# Patient Record
Sex: Female | Born: 1980 | Race: Black or African American | Hispanic: No | Marital: Single | Smoking: Never smoker
Health system: Southern US, Community
[De-identification: ages and names within clinical notes are randomized; demographics above are authoritative.]

## PROBLEM LIST (undated history)

## (undated) DIAGNOSIS — B009 Herpesviral infection, unspecified: Secondary | ICD-10-CM

## (undated) DIAGNOSIS — D649 Anemia, unspecified: Secondary | ICD-10-CM

## (undated) DIAGNOSIS — I10 Essential (primary) hypertension: Secondary | ICD-10-CM

## (undated) DIAGNOSIS — G43909 Migraine, unspecified, not intractable, without status migrainosus: Secondary | ICD-10-CM

## (undated) HISTORY — PX: CHOLECYSTECTOMY: SHX55

---

## 2011-02-19 ENCOUNTER — Inpatient Hospital Stay (HOSPITAL_COMMUNITY)
Admission: AD | Admit: 2011-02-19 | Discharge: 2011-02-21 | DRG: 373 | Disposition: A | Discharge status: Home / Self Care | Payer: BC Managed Care – PPO | Source: Ambulatory Visit | Attending: Obstetrics and Gynecology | Admitting: Obstetrics and Gynecology

## 2011-02-19 LAB — CBC
HCT: 42.7 % (ref 36.0–46.0)
MCHC: 32.8 g/dL (ref 30.0–36.0)
Platelets: 213 10*3/uL (ref 150–400)
RDW: 14.8 % (ref 11.5–15.5)
WBC: 9.3 10*3/uL (ref 4.0–10.5)

## 2011-02-19 LAB — RPR: RPR Ser Ql: NONREACTIVE

## 2011-02-20 LAB — CBC
MCV: 72.8 fL — ABNORMAL LOW (ref 78.0–100.0)
Platelets: 194 10*3/uL (ref 150–400)
RBC: 5.14 MIL/uL — ABNORMAL HIGH (ref 3.87–5.11)
RDW: 14.7 % (ref 11.5–15.5)
WBC: 12.4 10*3/uL — ABNORMAL HIGH (ref 4.0–10.5)

## 2011-02-21 ENCOUNTER — Inpatient Hospital Stay (HOSPITAL_COMMUNITY): Admission: AD | Admit: 2011-02-21 | Payer: Self-pay | Source: Home / Self Care | Admitting: Obstetrics and Gynecology

## 2012-04-25 ENCOUNTER — Emergency Department (HOSPITAL_COMMUNITY)
Admission: EM | Admit: 2012-04-25 | Discharge: 2012-04-25 | Disposition: A | Discharge status: Home / Self Care | Payer: BC Managed Care – PPO | Attending: Emergency Medicine | Admitting: Emergency Medicine

## 2012-04-25 ENCOUNTER — Emergency Department (HOSPITAL_COMMUNITY): Payer: BC Managed Care – PPO

## 2012-04-25 ENCOUNTER — Encounter (HOSPITAL_COMMUNITY): Payer: Self-pay | Admitting: Emergency Medicine

## 2012-04-25 DIAGNOSIS — M549 Dorsalgia, unspecified: Secondary | ICD-10-CM | POA: Insufficient documentation

## 2012-04-25 DIAGNOSIS — R1011 Right upper quadrant pain: Secondary | ICD-10-CM | POA: Insufficient documentation

## 2012-04-25 DIAGNOSIS — R109 Unspecified abdominal pain: Secondary | ICD-10-CM

## 2012-04-25 DIAGNOSIS — R110 Nausea: Secondary | ICD-10-CM | POA: Insufficient documentation

## 2012-04-25 HISTORY — DX: Herpesviral infection, unspecified: B00.9

## 2012-04-25 LAB — CBC
HCT: 38.7 % (ref 36.0–46.0)
Hemoglobin: 12.7 g/dL (ref 12.0–15.0)
MCHC: 32.8 g/dL (ref 30.0–36.0)
RBC: 5.61 MIL/uL — ABNORMAL HIGH (ref 3.87–5.11)
WBC: 10.3 10*3/uL (ref 4.0–10.5)

## 2012-04-25 LAB — URINALYSIS, ROUTINE W REFLEX MICROSCOPIC
Glucose, UA: NEGATIVE mg/dL
Ketones, ur: NEGATIVE mg/dL
Nitrite: NEGATIVE
Protein, ur: NEGATIVE mg/dL
pH: 5.5 (ref 5.0–8.0)

## 2012-04-25 LAB — BASIC METABOLIC PANEL
BUN: 10 mg/dL (ref 6–23)
CO2: 23 mEq/L (ref 19–32)
Chloride: 105 mEq/L (ref 96–112)
GFR calc non Af Amer: >90 mL/min (ref >90)
Glucose, Bld: 96 mg/dL (ref 70–99)
Potassium: 4.4 mEq/L (ref 3.5–5.1)
Sodium: 136 mEq/L (ref 135–145)

## 2012-04-25 LAB — HEPATIC FUNCTION PANEL
ALT: 10 U/L (ref 0–35)
Alkaline Phosphatase: 71 U/L (ref 39–117)
Bilirubin, Direct: <0.1 mg/dL (ref 0.0–0.3)

## 2012-04-25 LAB — URINE MICROSCOPIC-ADD ON

## 2012-04-25 LAB — PREGNANCY, URINE: Preg Test, Ur: NEGATIVE

## 2012-04-25 MED ORDER — FAMOTIDINE 20 MG PO TABS: 20.0000 mg | ORAL_TABLET | Freq: Two times a day (BID) | ORAL | Status: DC

## 2012-04-25 NOTE — ED Notes (Signed)
Patient transported to Ultrasound 

## 2012-04-25 NOTE — ED Provider Notes (Signed)
Medical screening examination/treatment/procedure(s) were performed by non-physician practitioner and as supervising physician I was immediately available for consultation/collaboration.   Lyanne Co, MD 04/25/12 [phone]

## 2012-04-25 NOTE — Discharge Instructions (Signed)
Your labs today are normal.  Your ultrasound showed a normal gallbladder, however, there was a lesion/area that appeared abnormal that was noted over the liver. This most likely is an incidental finding and may be benign, however, it is recommended that you have an MRI of abdomen for further evaluation in 6 months, scheduled by your primary care doctor.  Start pepcid for gastritis daily for few weeks. Follow up. Return if worsening   Abdominal Pain (Nonspecific) Your exam might not show the exact reason you have abdominal pain. Since there are many different causes of abdominal pain, another checkup and more tests may be needed. It is very important to follow up for lasting (persistent) or worsening symptoms. A possible cause of abdominal pain in any person who still has his or her appendix is acute appendicitis. Appendicitis is often hard to diagnose. Normal blood tests, urine tests, ultrasound, and CT scans do not completely rule out early appendicitis or other causes of abdominal pain. Sometimes, only the changes that happen over time will allow appendicitis and other causes of abdominal pain to be determined. Other potential problems that may require surgery may also take time to become more apparent. Because of this, it is important that you follow all of the instructions below. HOME CARE INSTRUCTIONS   Rest as much as possible.   Do not eat solid food until your pain is gone.   While adults or children have pain: A diet of water, weak decaffeinated tea, broth or bouillon, gelatin, oral rehydration solutions (ORS), frozen ice pops, or ice chips may be helpful.   When pain is gone in adults or children: Start a light diet (dry toast, crackers, applesauce, or white rice). Increase the diet slowly as long as it does not bother you. Eat no dairy products (including cheese and eggs) and no spicy, fatty, fried, or high-fiber foods.   Use no alcohol, caffeine, or cigarettes.   Take your regular  medicines unless your caregiver told you not to.   Take any prescribed medicine as directed.   Only take over-the-counter or prescription medicines for pain, discomfort, or fever as directed by your caregiver. Do not give aspirin to children.  If your caregiver has given you a follow-up appointment, it is very important to keep that appointment. Not keeping the appointment could result in a permanent injury and/or lasting (chronic) pain and/or disability. If there is any problem keeping the appointment, you must call to reschedule.  SEEK IMMEDIATE MEDICAL CARE IF:   Your pain is not gone in 24 hours.   Your pain becomes worse, changes location, or feels different.   You or your child has an oral temperature above 102 F (38.9 C), not controlled by medicine.   Your baby is older than 3 months with a rectal temperature of 102 F (38.9 C) or higher.   Your baby is 18 months old or younger with a rectal temperature of 100.4 F (38 C) or higher.   You have shaking chills.   You keep throwing up (vomiting) or cannot drink liquids.   There is blood in your vomit or you see blood in your bowel movements.   Your bowel movements become dark or black.   You have frequent bowel movements.   Your bowel movements stop (become blocked) or you cannot pass gas.   You have bloody, frequent, or painful urination.   You have yellow discoloration in the skin or whites of the eyes.   Your stomach becomes bloated or  bigger.   You have dizziness or fainting.   You have chest or back pain.  MAKE SURE YOU:   Understand these instructions.   Will watch your condition.   Will get help right away if you are not doing well or get worse.  Document Released: 10/18/2005 Document Revised: 10/07/2011 Document Reviewed: 09/15/2009 [facility] Patient Information 2012 [place], [place].

## 2012-04-25 NOTE — ED Provider Notes (Signed)
History     CSN: [id number]  Arrival date & time 04/25/12  0251   First MD Initiated Contact with Patient 04/25/12 [phone]      Chief Complaint  Patient presents with  . Abdominal Pain    (Consider location/radiation/quality/duration/timing/severity/associated sxs/prior treatment) Patient is a 31 y.o. female presenting with abdominal pain. The history is provided by the patient.  Abdominal Pain The primary symptoms of the illness include abdominal pain and nausea. The primary symptoms of the illness do not include fever, vomiting, diarrhea, dysuria, vaginal discharge or vaginal bleeding. The current episode started 3 to 5 hours ago. The onset of the illness was sudden. The problem has been gradually improving.  Additional symptoms associated with the illness include back pain. Symptoms associated with the illness do not include chills or constipation.  Pt states she woke up around 1am with severe right upper abdominal pain, nausea. States sat on a toilet, thinking she may have diarrhea, but had a normal bowel movement. States pain subsided slightly, and she was able to go to bed. Woke up an hour later, with worse pain than before, nausea, pain radiated to the right flank and right back. States no vomiting, no fever, no problems with bowels. No history of the same, however, does recall feeling nauseated after eating chinese food last week. Pt states since she has been here in ED (4 hrs now), she has been feeling better. Took alka seltzer at home with no improvement at that time.   Past Medical History  Diagnosis Date  . Herpes     History reviewed. No pertinent past surgical history.  Family History  Problem Relation Age of Onset  . Hypertension Other   . Diabetes Other     History  Substance Use Topics  . Smoking status: Never Smoker   . Smokeless tobacco: Not on file  . Alcohol Use: No    OB History    Grav Para Term Preterm Abortions TAB SAB Ect Mult Living                   Review of Systems  Constitutional: Negative for fever and chills.  Respiratory: Negative.   Cardiovascular: Negative.   Gastrointestinal: Positive for nausea and abdominal pain. Negative for vomiting, diarrhea, constipation and blood in stool.  Genitourinary: Positive for flank pain. Negative for dysuria, vaginal bleeding, vaginal discharge and pelvic pain.  Musculoskeletal: Positive for back pain.  Skin: Negative.   Neurological: Negative for dizziness and weakness.    Allergies  Review of patient's allergies indicates no known allergies.  Home Medications  No current outpatient prescriptions on file.  BP 144/83  Pulse 76  Temp 98.4 F (36.9 C) (Oral)  Resp 18  SpO2 100%  LMP 03/26/2012  Physical Exam  Nursing note and vitals reviewed. Constitutional: She appears well-developed and well-nourished. No distress.  HENT:  Head: Normocephalic.  Eyes: Conjunctivae are normal. No scleral icterus.  Neck: Neck supple.  Cardiovascular: Normal rate, regular rhythm and normal heart sounds.   Pulmonary/Chest: Effort normal and breath sounds normal. No respiratory distress. She has no wheezes. She has no rales.  Abdominal: Soft. Bowel sounds are normal. She exhibits no distension.       RUQ tenderness, no guarding, no rebound tenderness. No CVA tenderness bilatrally  Neurological: She is alert.  Skin: Skin is warm and dry.  Psychiatric: She has a normal mood and affect.    ED Course  Procedures (including critical care time) Results for orders placed  during the hospital encounter of 04/25/12  CBC      Component Value Range   WBC 10.3  4.0 - 10.5 K/uL   RBC 5.61 (*) 3.87 - 5.11 MIL/uL   Hemoglobin 12.7  12.0 - 15.0 g/dL   HCT 16.1  09.6 - 04.5 %   MCV 69.0 (*) 78.0 - 100.0 fL   MCH 22.6 (*) 26.0 - 34.0 pg   MCHC 32.8  30.0 - 36.0 g/dL   RDW 40.9  81.1 - 91.4 %   Platelets 225  150 - 400 K/uL  BASIC METABOLIC PANEL      Component Value Range   Sodium 136  135 - 145  mEq/L   Potassium 4.4  3.5 - 5.1 mEq/L   Chloride 105  96 - 112 mEq/L   CO2 23  19 - 32 mEq/L   Glucose, Bld 96  70 - 99 mg/dL   BUN 10  6 - 23 mg/dL   Creatinine, Ser 7.82  0.50 - 1.10 mg/dL   Calcium 9.6  8.4 - 95.6 mg/dL   GFR calc non Af Amer >90  >90 mL/min   GFR calc Af Amer >90  >90 mL/min  LIPASE, BLOOD      Component Value Range   Lipase 49  11 - 59 U/L  PREGNANCY, URINE      Component Value Range   Preg Test, Ur NEGATIVE  NEGATIVE  URINALYSIS, ROUTINE W REFLEX MICROSCOPIC      Component Value Range   Color, Urine YELLOW  YELLOW   APPearance CLOUDY (*) CLEAR   Specific Gravity, Urine 1.014  1.005 - 1.030   pH 5.5  5.0 - 8.0   Glucose, UA NEGATIVE  NEGATIVE mg/dL   Hgb urine dipstick NEGATIVE  NEGATIVE   Bilirubin Urine NEGATIVE  NEGATIVE   Ketones, ur NEGATIVE  NEGATIVE mg/dL   Protein, ur NEGATIVE  NEGATIVE mg/dL   Urobilinogen, UA 0.2  0.0 - 1.0 mg/dL   Nitrite NEGATIVE  NEGATIVE   Leukocytes, UA SMALL (*) NEGATIVE  URINE MICROSCOPIC-ADD ON      Component Value Range   Squamous Epithelial / LPF RARE  RARE   WBC, UA 0-2  <3 WBC/hpf   Bacteria, UA RARE  RARE   No results found.  6:57 AM Pt seen and examined.  Pt with severe RUQ pain, colicky in nature. Labs and UA unremarkable. PT feeling better. Will get [place] for evaluation of gallbladder.   US Abdomen Complete  04/25/2012  *RADIOLOGY REPORT*  Clinical Data:  Abdominal pain  COMPLETE ABDOMINAL ULTRASOUND  Comparison:  None.  Findings:  Gallbladder:  No gallstones, gallbladder wall thickening, or pericholecystic fluid.  Negative sonographic Murphy's sign.  Common bile duct:  Measures 6 mm.  Liver:  Vague 2.8 x 3.0 x 3.4 cm lesion in the central liver. Within normal limits in parenchymal echogenicity.  IVC:  Appears normal.  Pancreas:  Incompletely visualized but grossly unremarkable.  Spleen:  Measures 8.9 cm.  Right Kidney:  Measures 10.9 cm.  No mass or hydronephrosis.  Left Kidney:  Measures 10.5 cm.  No mass or  hydronephrosis.  Abdominal aorta:  No aneurysm identified.  IMPRESSION: Normal sonographic appearance the gallbladder.  3.4 cm lesion in the central liver, poorly characterized, statistically likely benign.  In the setting of normal LFTs, this likely reflects an incidental finding.  Consider follow-up MRI abdomen with/without contrast in 6 months for further characterization.  This recommendation follows ACR consensus guidelines: Managing Incidental Findings  on Abdominal CT: White Paper of the ACR Incidental Findings Committee.  J Am Coll Radiol 2130;8:657-846  Original Report Authenticated By: Charline Bills, M.D.    8:38 AM [place] negative. Pt is pain free. No nausea/vomiting, no abdominal pain. Abdomen reassessed, and now non tender. Explained results including the incidental liver lesion, instructed to follow up. Will d/c home.     1. Abdominal pain       MDM          Lottie Mussel, PA 04/25/12 [phone]

## 2012-04-25 NOTE — ED Notes (Signed)
Pt states she woke up at 0130 with abd pain and nausea  Pt states at that time she got up and had a normal BM  Pt states since then the pain has increased and started radiating into her back   Pt states she took alka seltzer without relief

## 2012-04-25 NOTE — ED Notes (Signed)
Pt stated that pain went away. Pt stated that she feels "gasy". Pt denies nausea or other discomfort.

## 2012-05-15 ENCOUNTER — Other Ambulatory Visit: Payer: Self-pay | Admitting: Internal Medicine

## 2012-05-15 DIAGNOSIS — R160 Hepatomegaly, not elsewhere classified: Secondary | ICD-10-CM

## 2012-10-11 ENCOUNTER — Other Ambulatory Visit: Payer: BC Managed Care – PPO

## 2012-10-12 ENCOUNTER — Ambulatory Visit
Admission: RE | Admit: 2012-10-12 | Discharge: 2012-10-12 | Disposition: A | Discharge status: Home / Self Care | Payer: BC Managed Care – PPO | Source: Ambulatory Visit | Attending: Internal Medicine | Admitting: Internal Medicine

## 2012-10-12 DIAGNOSIS — R160 Hepatomegaly, not elsewhere classified: Secondary | ICD-10-CM

## 2012-10-12 MED ORDER — GADOXETATE DISODIUM 0.25 MMOL/ML IV SOLN
9.0000 mL | Freq: Once | INTRAVENOUS | Status: AC | PRN
  Administered 2012-10-12: 9 mL via INTRAVENOUS

## 2012-11-16 ENCOUNTER — Other Ambulatory Visit: Payer: BC Managed Care – PPO

## 2015-09-03 ENCOUNTER — Other Ambulatory Visit: Payer: Self-pay | Admitting: Nurse Practitioner

## 2015-09-03 DIAGNOSIS — N644 Mastodynia: Secondary | ICD-10-CM

## 2015-09-10 ENCOUNTER — Ambulatory Visit
Admission: RE | Admit: 2015-09-10 | Discharge: 2015-09-10 | Disposition: A | Discharge status: Home / Self Care | Payer: Managed Care, Other (non HMO) | Source: Ambulatory Visit | Attending: Nurse Practitioner | Admitting: Nurse Practitioner

## 2015-09-10 DIAGNOSIS — N644 Mastodynia: Secondary | ICD-10-CM

## 2017-04-21 ENCOUNTER — Ambulatory Visit (INDEPENDENT_AMBULATORY_CARE_PROVIDER_SITE_OTHER): Payer: Worker's Compensation

## 2017-04-21 ENCOUNTER — Ambulatory Visit (HOSPITAL_COMMUNITY)
Admission: EM | Admit: 2017-04-21 | Discharge: 2017-04-21 | Disposition: A | Discharge status: Home / Self Care | Payer: Worker's Compensation | Attending: Family Medicine | Admitting: Family Medicine

## 2017-04-21 ENCOUNTER — Encounter (HOSPITAL_COMMUNITY): Payer: Self-pay | Admitting: Family Medicine

## 2017-04-21 DIAGNOSIS — M79641 Pain in right hand: Secondary | ICD-10-CM

## 2017-04-21 HISTORY — DX: Essential (primary) hypertension: I10

## 2017-04-21 MED ORDER — MELOXICAM 15 MG PO TABS: 15.0000 mg | ORAL_TABLET | Freq: Every day | ORAL | 2 refills | Status: DC

## 2017-04-21 NOTE — ED Provider Notes (Signed)
CSN: [id number]     Arrival date & time 04/21/17  1821 History   First MD Initiated Contact with Patient 04/21/17 1904     Chief Complaint  Patient presents with  . Hand Injury   (Consider location/radiation/quality/duration/timing/severity/associated sxs/prior Treatment) Rachel Newman is a 36 y.o. female with a past history of hypertension, who presents [facility] with a chief complaint of right hand pain. 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 Denies any other complaints.   The history is provided by the patient.  Hand Injury  Location:  Hand Hand location:  R hand Injury: yes   Time since incident:  3 hours Mechanism of injury comment:  Work related Pain details:    Quality:  Aching and throbbing   Radiates to:  Does not radiate   Severity:  Moderate   Onset quality:  Sudden   Duration:  3 hours   Timing:  Constant   Progression:  Unchanged Handedness:  Right-handed Dislocation: no   Prior injury to area:  No Relieved by:  None tried Worsened by:  Movement Ineffective treatments:  None tried Associated symptoms: numbness and swelling   Associated symptoms: no decreased range of motion, no fatigue, no muscle weakness, no neck pain, no stiffness and no tingling     Past Medical History:  Diagnosis Date  . Herpes   . Hypertension    History reviewed. No pertinent surgical history. Family History  Problem Relation Age of Onset  . Hypertension Other   . Diabetes Other    Social History  Substance Use Topics  . Smoking status: Never Smoker  . Smokeless tobacco: Not on file  . Alcohol use No   OB History    No data available     Review of Systems  Constitutional: Negative for chills and fatigue.  HENT:  Negative.   Respiratory: Negative.   Cardiovascular: Negative.   Gastrointestinal: Negative.   Musculoskeletal: Negative for neck pain, neck stiffness and stiffness.       Right hand pain  Skin: Negative.   Neurological: Negative for seizures and numbness.    Allergies  Patient has no known allergies.  Home Medications   Prior to Admission medications   Medication Sig Start Date End Date Taking? Authorizing Provider  famotidine (PEPCID) 20 MG tablet Take 1 tablet (20 mg total) by mouth 2 (two) times daily. 04/25/12 04/25/13  Kirichenko, Lemont Fillers, PA-C  meloxicam (MOBIC) 15 MG tablet Take 1 tablet (15 mg total) by mouth daily. 04/21/17   Dorena Bodo, NP   Meds Ordered and Administered this Visit  Medications - No data to display  BP (!) 144/90   Pulse 77   Resp 18   LMP 03/25/2017 (Exact Date)   SpO2 100%  No data found.   Physical Exam  Constitutional: She is oriented to person, place, and time. She appears well-developed and well-nourished. No distress.  HENT:  Head: Normocephalic and atraumatic.  Right Ear: External ear normal.  Left Ear: External ear normal.  Eyes: Conjunctivae are normal.  Neck: Normal range of motion.  Neurological: She is alert and oriented to person, place, and time.  Skin: Skin is warm and dry. Capillary refill takes less than 2 seconds.  No rash noted. She is not diaphoretic. No erythema.  Psychiatric: She has a normal mood and affect. Her behavior is normal.  Nursing note and vitals reviewed.   Urgent Care Course     Procedures (including critical care time)  Labs Review Labs Reviewed - No data to display  Imaging Review Dg Hand Complete Right  Result Date: 04/21/2017 CLINICAL DATA:  Finger jammed between freezer door and electronic wheelchair. EXAM: RIGHT HAND - COMPLETE 3+ VIEW COMPARISON:  None. FINDINGS: There is no evidence of fracture or dislocation. There is no evidence of arthropathy or other focal bone abnormality. Soft  tissues are unremarkable. IMPRESSION: Negative. Electronically Signed   By: Awilda Metroourtnay  Bloomer M.D.   On: 04/21/2017 19:36        MDM   1. Right hand pain     No fracture dislocation seen on x-ray, most likely soft tissue injury. Recommend rest, ice, elevation, wrist placed in a splint, started on meloxicam as needed time anti-inflammatory. Follow-up with occupational health for further evaluation and management of symptoms persist.     Dorena BodoKennard, Earnie Rockhold, NP 04/21/17 2014

## 2017-04-21 NOTE — ED Triage Notes (Signed)
Pt here for for right hand injury that occurred at work today.

## 2017-04-21 NOTE — Discharge Instructions (Signed)
X-ray negative for fracture dislocation, most likely soft tissue injury. Started on meloxicam as an anti-inflammatory, and we have splinted your wrist. If your pain persists, follow-up with occupational health for further evaluation and management. May return to full duty as needed.

## 2017-08-25 ENCOUNTER — Emergency Department (HOSPITAL_COMMUNITY)
Admission: EM | Admit: 2017-08-25 | Discharge: 2017-08-25 | Disposition: A | Discharge status: Home / Self Care | Payer: 59 | Attending: Emergency Medicine | Admitting: Emergency Medicine

## 2017-08-25 ENCOUNTER — Other Ambulatory Visit: Payer: Self-pay

## 2017-08-25 ENCOUNTER — Encounter (HOSPITAL_COMMUNITY): Payer: Self-pay | Admitting: *Deleted

## 2017-08-25 ENCOUNTER — Emergency Department (HOSPITAL_COMMUNITY): Payer: 59

## 2017-08-25 DIAGNOSIS — I10 Essential (primary) hypertension: Secondary | ICD-10-CM | POA: Diagnosis not present

## 2017-08-25 DIAGNOSIS — Z79899 Other long term (current) drug therapy: Secondary | ICD-10-CM | POA: Diagnosis not present

## 2017-08-25 DIAGNOSIS — R0789 Other chest pain: Secondary | ICD-10-CM | POA: Diagnosis not present

## 2017-08-25 DIAGNOSIS — R079 Chest pain, unspecified: Secondary | ICD-10-CM | POA: Diagnosis present

## 2017-08-25 LAB — BASIC METABOLIC PANEL
ANION GAP: 10 (ref 5–15)
BUN: 8 mg/dL (ref 6–20)
CHLORIDE: 105 mmol/L (ref 101–111)
CO2: 22 mmol/L (ref 22–32)
Calcium: 9.3 mg/dL (ref 8.9–10.3)
Creatinine, Ser: 0.82 mg/dL (ref 0.44–1.00)
GFR calc non Af Amer: >60 mL/min (ref >60)
GLUCOSE: 92 mg/dL (ref 65–99)
Potassium: 3.6 mmol/L (ref 3.5–5.1)
Sodium: 137 mmol/L (ref 135–145)

## 2017-08-25 LAB — CBC
HCT: 36.5 % (ref 36.0–46.0)
HEMOGLOBIN: 11.7 g/dL — AB (ref 12.0–15.0)
MCH: 21.5 pg — AB (ref 26.0–34.0)
MCHC: 32.1 g/dL (ref 30.0–36.0)
MCV: 67.2 fL — ABNORMAL LOW (ref 78.0–100.0)
Platelets: 289 10*3/uL (ref 150–400)
RBC: 5.43 MIL/uL — AB (ref 3.87–5.11)
RDW: 17.5 % — ABNORMAL HIGH (ref 11.5–15.5)
WBC: 6.6 10*3/uL (ref 4.0–10.5)

## 2017-08-25 LAB — I-STAT TROPONIN, ED
Troponin i, poc: 0 ng/mL (ref 0.00–0.08)
Troponin i, poc: 0 ng/mL (ref 0.00–0.08)

## 2017-08-25 MED ORDER — CYCLOBENZAPRINE HCL 10 MG PO TABS
5.0000 mg | ORAL_TABLET | Freq: Once | ORAL | Status: AC
  Administered 2017-08-25: 5 mg via ORAL
  Filled 2017-08-25: qty 1

## 2017-08-25 NOTE — ED Provider Notes (Signed)
[facility] Provider Note   CSN: [id number] Arrival date & time: 08/25/17  1611  History   Chief Complaint Chief Complaint  Patient presents with  . Chest Pain   HPI Rachel Newman Rachel Newman is a 36 y.o. female.  The history is provided by the patient.  Chest Pain   This is a new problem. The current episode started 6 to 12 hours ago. The problem occurs constantly. The problem has been gradually improving. The pain is present in the substernal region. The pain is at a severity of 2/10. The pain is mild. The quality of the pain is described as pressure-like. The pain does not radiate. Duration of episode(s) is 6 hours. Pertinent negatives include no abdominal pain, no back pain, no cough, no exertional chest pressure, no fever, no irregular heartbeat, no leg pain, no lower extremity edema, no malaise/fatigue, no nausea, no near-syncope, no numbness, no palpitations, no shortness of breath, no vomiting and no weakness. She has tried nothing for the symptoms.  Her past medical history is significant for hypertension.  Pertinent negatives for past medical history include no arrhythmia, no CAD, no COPD, no CHF, no diabetes, no DVT, no MI, no PE, no PVD, no recent injury, no seizures, no stimulant use, no strokes, no thyroid problem and no TIA.  Procedure history is negative for cardiac catheterization, echocardiogram, EPS study, persantine thallium, stress echo, stress thallium and exercise treadmill test.   Past Medical History:  Diagnosis Date  . Herpes   . Hypertension    There are no active problems to display for this patient.  History reviewed. No pertinent surgical history.  OB History    No data available     Home Medications   Prior to Admission medications   Medication Sig Start Date End Date Taking? Authorizing Provider  acetaminophen (TYLENOL) 325 MG tablet Take 325-650 mg by mouth every 6 (six) hours as needed for mild pain or headache.   Yes  [provider]  hydrochlorothiazide (HYDRODIURIL) 12.5 MG tablet Take 12.5 mg by mouth daily.   Yes [provider]  ibuprofen (ADVIL,MOTRIN) 200 MG tablet Take 200-400 mg by mouth every 6 (six) hours as needed for headache or mild pain.   Yes [provider]  Omega-3 Fatty Acids (FISH OIL) 1000 MG CAPS Take 1,000 mg by mouth every other day.   Yes [provider]  famotidine (PEPCID) 20 MG tablet Take 1 tablet (20 mg total) by mouth 2 (two) times daily. Patient not taking: Reported on 08/25/2017 04/25/12 08/25/17  Jaynie CrumbleKirichenko, Tatyana, PA-C  meloxicam (MOBIC) 15 MG tablet Take 1 tablet (15 mg total) by mouth daily. Patient not taking: Reported on 08/25/2017 04/21/17   Dorena BodoKennard, Lawrence, NP   Family History Family History  Problem Relation Age of Onset  . Hypertension Other   . Diabetes Other    Social History Social History  Substance Use Topics  . Smoking status: Never Smoker  . Smokeless tobacco: Not on file  . Alcohol use No   Allergies   Patient has no known allergies.  Review of Systems Review of Systems  Constitutional: Negative for chills, fever and malaise/fatigue.  HENT: Negative for ear pain and sore throat.   Eyes: Negative for pain and visual disturbance.  Respiratory: Negative for cough and shortness of breath.   Cardiovascular: Positive for chest pain. Negative for palpitations and near-syncope.  Gastrointestinal: Negative for abdominal pain, nausea and vomiting.  Genitourinary: Negative for dysuria and hematuria.  Musculoskeletal: Negative for arthralgias and back pain.  Skin: Negative for color change and rash.  Neurological: Negative for seizures, syncope, weakness and numbness.  All other systems reviewed and are negative.  Physical Exam Updated Vital Signs BP (!) 143/94 (BP Location: Right Arm)   Pulse 83   Temp (!) 97.5 F (36.4 C) (Oral)   Resp 18   LMP 07/26/2017 (Exact Date)   SpO2 100%   Physical Exam    Constitutional: She is oriented to person, place, and time. She appears well-developed and well-nourished. No distress.  HENT:  Head: Normocephalic and atraumatic.  Nose: Nose normal.  Eyes: Pupils are equal, round, and reactive to light. Conjunctivae and EOM are normal.  Neck: Normal range of motion. Neck supple.  Cardiovascular: Normal rate and regular rhythm.   No murmur heard. Pulmonary/Chest: Effort normal and breath sounds normal. No respiratory distress. She has no wheezes. She has no rales.  Abdominal: Soft. She exhibits no distension. There is no tenderness.  Musculoskeletal: Normal range of motion. She exhibits no edema, tenderness or deformity.  Neurological: She is alert and oriented to person, place, and time. No cranial nerve deficit or sensory deficit. She exhibits normal muscle tone. Coordination normal.  Skin: Skin is warm and dry. No rash noted. She is not diaphoretic. No erythema. No pallor.  Psychiatric: She has a normal mood and affect.  Nursing note and vitals reviewed.  ED Treatments / Results  Labs (all labs ordered are listed, but only abnormal results are displayed) Labs Reviewed  CBC - Abnormal; Notable for the following:       Result Value   RBC 5.43 (*)    Hemoglobin 11.7 (*)    MCV 67.2 (*)    MCH 21.5 (*)    RDW 17.5 (*)    All other components within normal limits  BASIC METABOLIC PANEL  I-STAT TROPONIN, ED   EKG  EKG Interpretation  Date/Time:  Thursday August 25 2017 16:21:32 EDT Ventricular Rate:  81 PR Interval:  158 QRS Duration: 86 QT Interval:  372 QTC Calculation: 432 R Axis:   76 Text Interpretation:  Normal sinus rhythm Normal ECG No old tracing to compare Confirmed by Eber Hong ([id number]) on 08/25/2017 8:43:14 PM      Radiology Dg Chest 2 View  Result Date: 08/25/2017 CLINICAL DATA:  Chest tightness.  Hypertension. EXAM: CHEST  2 VIEW COMPARISON:  None. FINDINGS: Lungs are clear. Heart size and pulmonary vascularity are  normal. No adenopathy. No pneumothorax. There is spina bifida occulta noted at C7 and T1, incidental findings. IMPRESSION: No edema or consolidation. Electronically Signed   By: Bretta Bang III M.D.   On: 08/25/2017 16:48   Procedures Procedures (including critical care time)  Medications Ordered in ED Medications - No data to display  Initial Impression / Assessment and Plan / ED Course  I have reviewed the triage vital signs and the nursing notes.  Pertinent labs & imaging results that were available during my care of the patient were reviewed by me and considered in my medical decision making (see chart for details).  Upon my evaluation patient patient endorses atypical chest pain today. Patient took aspirin prior to arrival, therefore was not given ASA here.  EKG I obtained reveals no anatomical ischemia representing STEMI, New-onset Arrhythmia, or ischemic equivalent. Therefore do not suspect ACS at this time. No concerns for Pericardial Tamponade on EKG and in light of patients hemodynamic stability. No pain related to supine or prone positions  and given EKG doubt Pericarditis.   Per the patient's absence of CAD will send Troponin. First of which is negative, Repeat troponin at 3 hour interval was also negative. Therefore also do not suspect Myocarditis.   CXR unremarkable for focal airspace disease, patient is afebrile,  cough, and WBC shows no leukocytosis, do not suspect Pneumonia. Without evidence of Pneumothorax. CXR without concern for Esophageal Tear and there is no recent intractable emesis or esophageal instrumentation.  No peritonitis or free air on CXR worrisome for Perforated Abdominal Viscous.  Unlikely Pulmonary Embolism as patient denies estrogen supplementation.  Age <50 years. (36 y.o.). Denies malignancy with treatment in last 6 months. No previous Hx of DVT/PE. Patient denies hemoptysis. No unilateral leg swelling observed on exam. Oxygenation saturation has  been maintained >95% & HR has been <100 since since arrival to the ED. No recent surgery or trauma to lower extremities or travel involving prolonged car or plane ride. Therefore will not obtain CTA Chest or D-dimer.  Pain is not described as tearing and does not radiate to back, doubt Aortic Dissection. Pulses present bilaterally in upper and lower extremities. CXR does not show widened mediastinum.  My H&P and patient's clinical course is not currently consistent with any of the above emergency medical conditions at this time. At this time feel that the patient is safe and stable for discharge. Advised the patient to follow up with her PCP in the next week and to follow up with cardiology in the 2 weeks for further evaluation if needed. She was given strict return precaution and advised to return new, worsening or reoccurring symptoms. At this time the patient is in agreement with the plan and feels safe for discharge home.   Final Clinical Impressions(s) / ED Diagnoses   Final diagnoses:  Hypertension, unspecified type  Atypical chest pain   New Prescriptions New Prescriptions   No medications on file     Lamont Snowball, MD 08/25/17 2241    Eber Hong, MD 08/28/17 [phone]

## 2017-08-25 NOTE — ED Triage Notes (Signed)
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 [facility] and told her ECG was normal and to follow up with pcp. Once pt got back to work this feeling happened again so she come to the ED. No nausea or vomiting. No diaphoresis

## 2018-02-27 ENCOUNTER — Other Ambulatory Visit: Payer: Self-pay | Admitting: Internal Medicine

## 2018-02-27 DIAGNOSIS — N63 Unspecified lump in unspecified breast: Secondary | ICD-10-CM

## 2018-03-02 ENCOUNTER — Other Ambulatory Visit: Payer: Self-pay | Admitting: Internal Medicine

## 2018-03-02 DIAGNOSIS — R921 Mammographic calcification found on diagnostic imaging of breast: Secondary | ICD-10-CM

## 2018-03-03 ENCOUNTER — Ambulatory Visit
Admission: RE | Admit: 2018-03-03 | Discharge: 2018-03-03 | Disposition: A | Discharge status: Home / Self Care | Payer: 59 | Source: Ambulatory Visit | Attending: Internal Medicine | Admitting: Internal Medicine

## 2018-03-03 DIAGNOSIS — R921 Mammographic calcification found on diagnostic imaging of breast: Secondary | ICD-10-CM

## 2018-07-10 ENCOUNTER — Encounter: Payer: Self-pay | Admitting: Emergency Medicine

## 2018-07-10 ENCOUNTER — Emergency Department (INDEPENDENT_AMBULATORY_CARE_PROVIDER_SITE_OTHER): Payer: 59

## 2018-07-10 ENCOUNTER — Other Ambulatory Visit: Payer: Self-pay

## 2018-07-10 ENCOUNTER — Emergency Department
Admission: EM | Admit: 2018-07-10 | Discharge: 2018-07-10 | Disposition: A | Discharge status: Home / Self Care | Payer: 59 | Source: Home / Self Care | Attending: Family Medicine | Admitting: Family Medicine

## 2018-07-10 DIAGNOSIS — R51 Headache: Secondary | ICD-10-CM

## 2018-07-10 DIAGNOSIS — R519 Headache, unspecified: Secondary | ICD-10-CM

## 2018-07-10 DIAGNOSIS — M26621 Arthralgia of right temporomandibular joint: Secondary | ICD-10-CM

## 2018-07-10 NOTE — Discharge Instructions (Addendum)
Apply ice pack for 20 minutes, 3 to 4 times daily  Continue until pain and swelling decrease.  May take Ibuprofen 200mg , 3 tabs every 8 hours with food.

## 2018-07-10 NOTE — ED Triage Notes (Signed)
Sinus headache, dizziness, pressure, dull pain behind right ear radiating down neck x 2 weeks

## 2018-07-10 NOTE — ED Provider Notes (Signed)
[facility]    CSN: [id number] Arrival date & time: 07/10/18  1613     History   Chief Complaint Chief Complaint  Patient presents with  . Sinusitis    HPI Rachel Newman is a 37 y.o. female.   Patient complains of intermittent pain/pressure over the frontal area of her forehead for about two months.  The pain occurs most days of the week except for about 3 days.  She feels that the pain is worsening, but does not awaken her.  No sinus congestion.  No fevers, chills, and sweats.  No other neurologic symptoms. She also complains of pain behind her right ear radiating down her right neck for two weeks.  She had a brief episode of dizziness initially, now resolved.  Ibuprofen has been somewhat helpful.  She feels well otherwise.  No fevers, chills, and sweats.  The history is provided by the patient.  Headache  Pain location:  Frontal Quality:  Dull Radiates to:  Does not radiate Severity currently:  4/10 Onset quality:  Gradual Duration:  8 weeks Timing:  Intermittent Progression:  Waxing and waning Chronicity:  New Context: emotional stress   Relieved by:  Nothing Worsened by:  Light Ineffective treatments:  Acetaminophen Associated symptoms: facial pain, neck pain and photophobia   Associated symptoms: no blurred vision, no congestion, no cough, no dizziness, no drainage, no ear pain, no eye pain, no fatigue, no fever, no focal weakness, no hearing loss, no loss of balance, no myalgias, no nausea, no near-syncope, no neck stiffness, no numbness, no paresthesias, no sinus pressure, no sore throat, no swollen glands, no syncope, no tingling, no URI, no visual change, no vomiting and no weakness     Past Medical History:  Diagnosis Date  . Herpes   . Hypertension     There are no active problems to display for this patient.   History reviewed. No pertinent surgical history.  OB History   None      Home Medications    Prior to Admission medications     Medication Sig Start Date End Date Taking? Authorizing Provider  acetaminophen (TYLENOL) 325 MG tablet Take 325-650 mg by mouth every 6 (six) hours as needed for mild pain or headache.    [provider]  famotidine (PEPCID) 20 MG tablet Take 1 tablet (20 mg total) by mouth 2 (two) times daily. Patient not taking: Reported on 08/25/2017 04/25/12 08/25/17  Jaynie Crumble, PA-C  hydrochlorothiazide (HYDRODIURIL) 12.5 MG tablet Take 12.5 mg by mouth daily.    [provider]  ibuprofen (ADVIL,MOTRIN) 200 MG tablet Take 200-400 mg by mouth every 6 (six) hours as needed for headache or mild pain.    [provider]  meloxicam (MOBIC) 15 MG tablet Take 1 tablet (15 mg total) by mouth daily. Patient not taking: Reported on 08/25/2017 04/21/17   Dorena Bodo, NP  Omega-3 Fatty Acids (FISH OIL) 1000 MG CAPS Take 1,000 mg by mouth every other day.    [provider]    Family History Family History  Problem Relation Age of Onset  . Hypertension Other   . Diabetes Other     Social History Social History   Tobacco Use  . Smoking status: Never Smoker  . Smokeless tobacco: Never Used  Substance Use Topics  . Alcohol use: No  . Drug use: No     Allergies   Patient has no known allergies.   Review of Systems Review of Systems  Constitutional:  Negative for fatigue and fever.  HENT: Negative for congestion, ear pain, hearing loss, postnasal drip, sinus pressure and sore throat.   Eyes: Positive for photophobia. Negative for blurred vision and pain.  Respiratory: Negative for cough.   Cardiovascular: Negative for syncope and near-syncope.  Gastrointestinal: Negative for nausea and vomiting.  Musculoskeletal: Positive for neck pain. Negative for myalgias and neck stiffness.  Neurological: Positive for headaches. Negative for dizziness, focal weakness, weakness, numbness, paresthesias and loss of balance.  All other systems reviewed and are  negative.    Physical Exam Triage Vital Signs ED Triage Vitals  Enc Vitals Group     BP 07/10/18 1637 (!) 159/96     Pulse Rate 07/10/18 1637 71     Resp --      Temp 07/10/18 1637 98.2 F (36.8 C)     Temp Source 07/10/18 1637 Oral     SpO2 07/10/18 1637 100 %     Weight 07/10/18 1638 232 lb (105.2 kg)     Height 07/10/18 1638 5\' 9"  (1.753 m)     Head Circumference --      Peak Flow --      Pain Score 07/10/18 1638 4     Pain Loc --      Pain Edu? --      Excl. in GC? --    No data found.  Updated Vital Signs BP (!) 159/96 (BP Location: Right Arm)   Pulse 71   Temp 98.2 F (36.8 C) (Oral)   Ht 5\' 9"  (1.753 m)   Wt 105.2 kg   LMP 06/26/2018 (Exact Date)   SpO2 100%   BMI 34.26 kg/m   Visual Acuity Right Eye Distance:   Left Eye Distance:   Bilateral Distance:    Right Eye Near:   Left Eye Near:    Bilateral Near:     Physical Exam Nursing notes and Vital Signs reviewed. Appearance:  Patient appears stated age, and in no acute distress. Eyes:  Pupils are equal, round, and reactive to light and accomodation.  Extraocular movement is intact.  Conjunctivae are not inflamed  Ears:  Canals normal.  Tympanic membranes normal.  ?mild right TMJ tenderness. Nose:  Mildly congested turbinates.  Mild maxillary sinus tenderness is present.  Pharynx:  Normal Neck:  Supple.  No adenopathy. Lungs:  Clear to auscultation.  Breath sounds are equal.  Moving air well. Heart:  Regular rate and rhythm without murmurs, rubs, or gallops.  Extremities:  No edema.  Skin:  No rash present.   Neurologic:  Cranial nerves 2 through 12 are normal.  Patellar, achilles, and elbow reflexes are normal.  Cerebellar function is intact (finger-to-nose and rapid alternating hand movement).  Gait and station are normal.    UC Treatments / Results  Labs (all labs ordered are listed, but only abnormal results are displayed) Labs Reviewed - No data to display  EKG None  Radiology Dg  Sinuses Complete  Result Date: 07/10/2018 CLINICAL DATA:  Pain and headaches over the frontal sinuses for 2 months. EXAM: PARANASAL SINUSES - COMPLETE 3 + VIEW COMPARISON:  None. FINDINGS: The paranasal sinus are aerated. There is no evidence of sinus opacification air-fluid levels or mucosal thickening. No significant bone abnormalities are seen. Mastoid air cells are clear. IMPRESSION: Normal paranasal sinuses. Electronically Signed   By: Francene Boyers M.D.   On: 07/10/2018 17:23    Procedures Procedures (including critical care time)  Medications Ordered in UC Medications - No  data to display  Initial Impression / Assessment and Plan / UC Course  I have reviewed the triage vital signs and the nursing notes.  Pertinent labs & imaging results that were available during my care of the patient were reviewed by me and considered in my medical decision making (see chart for details).    No evidence sinusitis. Recommend dental evaluation. Followup with ENT if not improved about 3 weeks.   Final Clinical Impressions(s) / UC Diagnoses   Final diagnoses:  Frontal headache  Arthralgia of right temporomandibular joint     Discharge Instructions     Apply ice pack for 20 minutes, 3 to 4 times daily  Continue until pain and swelling decrease.  May take Ibuprofen 200mg , 3 tabs every 8 hours with food.      ED Prescriptions    None         Lattie Haw, MD 07/11/18 [phone]

## 2018-11-17 ENCOUNTER — Other Ambulatory Visit: Payer: Self-pay | Admitting: Nurse Practitioner

## 2018-11-17 DIAGNOSIS — R1012 Left upper quadrant pain: Secondary | ICD-10-CM

## 2018-11-23 ENCOUNTER — Other Ambulatory Visit: Payer: Self-pay

## 2018-11-24 ENCOUNTER — Ambulatory Visit
Admission: RE | Admit: 2018-11-24 | Discharge: 2018-11-24 | Disposition: A | Discharge status: Home / Self Care | Payer: 59 | Source: Ambulatory Visit | Attending: Nurse Practitioner | Admitting: Nurse Practitioner

## 2018-11-24 DIAGNOSIS — R1012 Left upper quadrant pain: Secondary | ICD-10-CM

## 2019-10-20 ENCOUNTER — Emergency Department
Admission: EM | Admit: 2019-10-20 | Discharge: 2019-10-20 | Disposition: A | Discharge status: Home / Self Care | Payer: 59 | Attending: Emergency Medicine | Admitting: Emergency Medicine

## 2019-10-20 ENCOUNTER — Encounter: Payer: Self-pay | Admitting: Emergency Medicine

## 2019-10-20 ENCOUNTER — Emergency Department: Payer: 59

## 2019-10-20 ENCOUNTER — Other Ambulatory Visit: Payer: Self-pay

## 2019-10-20 DIAGNOSIS — R42 Dizziness and giddiness: Secondary | ICD-10-CM | POA: Diagnosis not present

## 2019-10-20 DIAGNOSIS — R079 Chest pain, unspecified: Secondary | ICD-10-CM | POA: Diagnosis present

## 2019-10-20 DIAGNOSIS — R202 Paresthesia of skin: Secondary | ICD-10-CM | POA: Diagnosis not present

## 2019-10-20 DIAGNOSIS — I10 Essential (primary) hypertension: Secondary | ICD-10-CM | POA: Diagnosis not present

## 2019-10-20 DIAGNOSIS — Z79899 Other long term (current) drug therapy: Secondary | ICD-10-CM | POA: Insufficient documentation

## 2019-10-20 DIAGNOSIS — R1012 Left upper quadrant pain: Secondary | ICD-10-CM | POA: Diagnosis not present

## 2019-10-20 LAB — BASIC METABOLIC PANEL
Anion gap: 10 (ref 5–15)
BUN: 11 mg/dL (ref 6–20)
CO2: 26 mmol/L (ref 22–32)
Calcium: 9.5 mg/dL (ref 8.9–10.3)
Chloride: 101 mmol/L (ref 98–111)
Creatinine, Ser: 0.79 mg/dL (ref 0.44–1.00)
GFR calc Af Amer: >60 mL/min (ref >60)
GFR calc non Af Amer: >60 mL/min (ref >60)
Glucose, Bld: 85 mg/dL (ref 70–99)
Potassium: 3.5 mmol/L (ref 3.5–5.1)
Sodium: 137 mmol/L (ref 135–145)

## 2019-10-20 LAB — LIPASE, BLOOD: Lipase: 28 U/L (ref 11–51)

## 2019-10-20 LAB — CBC
HCT: 37.8 % (ref 36.0–46.0)
Hemoglobin: 12.2 g/dL (ref 12.0–15.0)
MCH: 21.1 pg — ABNORMAL LOW (ref 26.0–34.0)
MCHC: 32.3 g/dL (ref 30.0–36.0)
MCV: 65.5 fL — ABNORMAL LOW (ref 80.0–100.0)
Platelets: 269 10*3/uL (ref 150–400)
RBC: 5.77 MIL/uL — ABNORMAL HIGH (ref 3.87–5.11)
RDW: 16.2 % — ABNORMAL HIGH (ref 11.5–15.5)
WBC: 7.4 10*3/uL (ref 4.0–10.5)
nRBC: 0 % (ref 0.0–0.2)

## 2019-10-20 LAB — POCT PREGNANCY, URINE: Preg Test, Ur: NEGATIVE

## 2019-10-20 LAB — TROPONIN I (HIGH SENSITIVITY)
Troponin I (High Sensitivity): <2 ng/L (ref <18)
Troponin I (High Sensitivity): <2 ng/L (ref <18)

## 2019-10-20 MED ORDER — IOHEXOL 350 MG/ML SOLN
100.0000 mL | Freq: Once | INTRAVENOUS | Status: AC | PRN
  Administered 2019-10-20: 20:00:00 100 mL via INTRAVENOUS

## 2019-10-20 NOTE — Discharge Instructions (Addendum)
Your lab test, EKG, and CT scan today were all okay.  We do not find any specific cause of your symptoms, but your evaluation today is reassuring.  Please follow-up with your doctor for further monitoring of your symptoms.

## 2019-10-20 NOTE — ED Notes (Signed)
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 Lipase added to labs, repeat troponin done at this time.

## 2019-10-20 NOTE — ED Notes (Signed)
Pt c/o chest pain upper chest that started as heaviness and now burning but has subsided since arrival, pt reports ready to leave after tests are done, pt aware awaiting CT scan, pt oriented to POC and approx wait time  Pt reports hx of reflux and declares intentional diet and positional changes to reduce flares

## 2019-10-20 NOTE — ED Triage Notes (Signed)
Pt arrived via [facility] with reports of CP while driving, pt reports feeling dizzy around 1pm, then having central chest pain radiating down left arm and c/o tingling on the left side, pt reports pain felt like a pressure, pt given 1 NTG with EMS which she states took pain from 4/10 to 2/10.  Pt reports hx of HTN, took meds this morning.  Pt given 324 ASA with EMS.  Pt following up with rheumatologist for possible autoimmune disorder.

## 2019-10-20 NOTE — ED Notes (Signed)
Peripheral IV discontinued. Catheter intact. No signs of infiltration or redness. Gauze applied to IV site.   Discharge instructions reviewed with patient. Questions fielded by this RN. Patient verbalizes understanding of instructions. Patient discharged home in stable condition per stafford . No acute distress noted at time of discharge.   Pt ambulatory to lobby

## 2019-10-20 NOTE — ED Provider Notes (Signed)
[facility] Emergency Department Provider Note  ____________________________________________  Time seen: Approximately 6:24 PM  I have reviewed the triage vital signs and the nursing notes.   HISTORY  Chief Complaint Chest Pain    HPI Rachel Newman is a 38 y.o. female with a history of hypertension who comes the ED complaining of central chest pain that started suddenly while driving today at about 1 PM.  Associated with a feeling of dizziness.  The pain is described as pressure, 4/10.  Improved to 2/10 after being given some nitroglycerin by EMS.  Initially the pain was also associated with paresthesia of the left side of the body which lasted for 2 minutes and then resolved.  She now reports that the pain radiates to her abdomen and lower back.  Denies any connective tissue disorder personally or in her family.  No history of joint laxity.  She is being evaluated by rheumatology for possible autoimmune disorder due to an elevated ANA level.  No recent exertional symptoms or pleuritic symptoms.  No travel trauma hospitalization surgery or history of DVT or PE or exogenous hormone use.     Past Medical History:  Diagnosis Date  . Herpes   . Hypertension      There are no problems to display for this patient.    History reviewed. No pertinent surgical history.   Prior to Admission medications   Medication Sig Start Date End Date Taking? Authorizing Provider  acetaminophen (TYLENOL) 325 MG tablet Take 325-650 mg by mouth every 6 (six) hours as needed for mild pain or headache.    [provider]  famotidine (PEPCID) 20 MG tablet Take 1 tablet (20 mg total) by mouth 2 (two) times daily. Patient not taking: Reported on 08/25/2017 04/25/12 08/25/17  Jaynie Crumble, PA-C  hydrochlorothiazide (HYDRODIURIL) 12.5 MG tablet Take 12.5 mg by mouth daily.    [provider]  ibuprofen (ADVIL,MOTRIN) 200 MG tablet Take 200-400 mg by mouth every  6 (six) hours as needed for headache or mild pain.    [provider]  meloxicam (MOBIC) 15 MG tablet Take 1 tablet (15 mg total) by mouth daily. Patient not taking: Reported on 08/25/2017 04/21/17   Dorena Bodo, NP  Omega-3 Fatty Acids (FISH OIL) 1000 MG CAPS Take 1,000 mg by mouth every other day.    [provider]     Allergies Patient has no known allergies.   Family History  Problem Relation Age of Onset  . Hypertension Other   . Diabetes Other     Social History Social History   Tobacco Use  . Smoking status: Never Smoker  . Smokeless tobacco: Never Used  Substance Use Topics  . Alcohol use: No  . Drug use: No    Review of Systems  Constitutional:   No fever or chills.  ENT:   No sore throat. No rhinorrhea. Cardiovascular: Positive as above chest pain without syncope. Respiratory:   No dyspnea or cough. Gastrointestinal:   Negative for abdominal pain, vomiting and diarrhea.  Musculoskeletal:   Negative for focal pain or swelling All other systems reviewed and are negative except as documented above in ROS and HPI.  ____________________________________________   PHYSICAL EXAM:  VITAL SIGNS: ED Triage Vitals [10/20/19 1424]  Enc Vitals Group     BP 128/76     Pulse Rate 88     Resp 18     Temp 98.7 F (37.1 C)     Temp Source Oral  SpO2 100 %     Weight 235 lb (106.6 kg)     Height 5\' 10"  (1.778 m)     Head Circumference      Peak Flow      Pain Score 2     Pain Loc      Pain Edu?      Excl. in GC?     Vital signs reviewed, nursing assessments reviewed.   Constitutional:   Alert and oriented. Non-toxic appearance. Eyes:   Conjunctivae are normal. EOMI. PERRL. ENT      Head:   Normocephalic and atraumatic.      Nose:   Wearing a mask.      Mouth/Throat:   Wearing a mask.      Neck:   No meningismus. Full ROM. Hematological/Lymphatic/Immunilogical:   No cervical lymphadenopathy. Cardiovascular:   RRR. Symmetric  bilateral radial and DP pulses.  No murmurs. Cap refill less than 2 seconds. Respiratory:   Normal respiratory effort without tachypnea/retractions. Breath sounds are clear and equal bilaterally. No wheezes/rales/rhonchi. Gastrointestinal:   Soft and nontender. Non distended. There is no CVA tenderness.  No rebound, rigidity, or guarding. Genitourinary:   deferred Musculoskeletal:   Normal range of motion in all extremities. No joint effusions.  No lower extremity tenderness.  No edema. Neurologic:   Normal speech and language.  Motor grossly intact. No acute focal neurologic deficits are appreciated.  Skin:    Skin is warm, dry and intact. No rash noted.  No petechiae, purpura, or bullae.  ____________________________________________    LABS (pertinent positives/negatives) (all labs ordered are listed, but only abnormal results are displayed) Labs Reviewed  CBC - Abnormal; Notable for the following components:      Result Value   RBC 5.77 (*)    MCV 65.5 (*)    MCH 21.1 (*)    RDW 16.2 (*)    All other components within normal limits  BASIC METABOLIC PANEL  LIPASE, BLOOD  POC URINE PREG, ED  POCT PREGNANCY, URINE  TROPONIN I (HIGH SENSITIVITY)  TROPONIN I (HIGH SENSITIVITY)   ____________________________________________   EKG  Interpreted by me  Date: 10/20/2019  Rate: 90  Rhythm: normal sinus rhythm  QRS Axis: normal  Intervals: normal  ST/T Wave abnormalities: normal  Conduction Disutrbances: none  Narrative Interpretation: unremarkable Unchanged compared to previous    ____________________________________________    RADIOLOGY  DG Chest 2 View  Result Date: 10/20/2019 CLINICAL DATA:  Left-sided chest pain. EXAM: CHEST - 2 VIEW COMPARISON:  Chest x-ray dated August 25, 2017. FINDINGS: The heart size and mediastinal contours are within normal limits. Both lungs are clear. The visualized skeletal structures are unremarkable. IMPRESSION: No active  cardiopulmonary disease. Electronically Signed   By: Obie DredgeWilliam T Derry M.D.   On: 10/20/2019 16:18   CT Angio Chest/Abd/Pel for Dissection W and/or Wo Contrast  Result Date: 10/20/2019 CLINICAL DATA:  Aortic disease. Nontraumatic. Central chest pain radiating down left arm. EXAM: CT ANGIOGRAPHY CHEST, ABDOMEN AND PELVIS TECHNIQUE: Multidetector CT imaging through the chest, abdomen and pelvis was performed using the standard protocol during bolus administration of intravenous contrast. Multiplanar reconstructed images and MIPs were obtained and reviewed to evaluate the vascular anatomy. CONTRAST:  100mL OMNIPAQUE IOHEXOL 350 MG/ML SOLN COMPARISON:  None. FINDINGS: CTA CHEST FINDINGS Cardiovascular: There is no evidence for thoracic aortic aneurysm or dissection. There is no large centrally located pulmonary embolism. Detection of smaller pulmonary emboli is limited by contrast bolus timing and technique. There is no  significant pericardial effusion. The heart size is normal. Mediastinum/Nodes: --No mediastinal or hilar lymphadenopathy. --No axillary lymphadenopathy. --No supraclavicular lymphadenopathy. --Normal thyroid gland. --The esophagus is unremarkable Lungs/Pleura: There is a tiny 2 mm micronodule in the right upper lobe (axial series 6, image 40). There are additional scattered micro nodules in the right upper lobe. There is no pneumothorax. No large pleural effusion. The trachea is unremarkable. Musculoskeletal: No chest wall abnormality. No acute or significant osseous findings. Review of the MIP images confirms the above findings. CTA ABDOMEN AND PELVIS FINDINGS VASCULAR Aorta: Normal caliber aorta without aneurysm, dissection, vasculitis or significant stenosis. Celiac: Patent without evidence of aneurysm, dissection, vasculitis or significant stenosis. SMA: Patent without evidence of aneurysm, dissection, vasculitis or significant stenosis. Renals: Both renal arteries are patent without evidence of  aneurysm, dissection, vasculitis, fibromuscular dysplasia or significant stenosis. IMA: Patent without evidence of aneurysm, dissection, vasculitis or significant stenosis. Inflow: Patent without evidence of aneurysm, dissection, vasculitis or significant stenosis. Veins: No obvious venous abnormality within the limitations of this arterial phase study. Review of the MIP images confirms the above findings. NON-VASCULAR Hepatobiliary: There is a hypodense 2.8 cm lesion in the left hepatic lobe that is favored to represent the patient's previously demonstrated hemangioma. Normal gallbladder.There is no biliary ductal dilation. Pancreas: Normal contours without ductal dilatation. No peripancreatic fluid collection. Spleen: No splenic laceration or hematoma. Adrenals/Urinary Tract: --Adrenal glands: No adrenal hemorrhage. --Right kidney/ureter: No hydronephrosis or perinephric hematoma. --Left kidney/ureter: No hydronephrosis or perinephric hematoma. --Urinary bladder: Unremarkable. Stomach/Bowel: --Stomach/Duodenum: No hiatal hernia or other gastric abnormality. Normal duodenal course and caliber. --Small bowel: No dilatation or inflammation. --Colon: No focal abnormality. --Appendix: Normal. Vascular/Lymphatic: Normal course and caliber of the major abdominal vessels. --No retroperitoneal lymphadenopathy. --No mesenteric lymphadenopathy. --No pelvic or inguinal lymphadenopathy. Reproductive: Unremarkable Other: No ascites or free air. The abdominal wall is normal. Musculoskeletal. No acute displaced fractures. Review of the MIP images confirms the above findings. IMPRESSION: 1. No acute thoracic, abdominal or pelvic pathology. Specifically, there is no evidence for aortic aneurysm or dissection. 2. Hypodense lesion in the left hepatic lobe is favored to represent the patient's previously demonstrated hemangioma. Electronically Signed   By: Katherine Mantle M.D.   On: 10/20/2019 20:16     ____________________________________________   PROCEDURES Procedures  ____________________________________________  DIFFERENTIAL DIAGNOSIS   Aortic dissection, non-STEMI  CLINICAL IMPRESSION / ASSESSMENT AND PLAN / ED COURSE  Medications ordered in the ED: Medications  iohexol (OMNIPAQUE) 350 MG/ML injection 100 mL (100 mLs Intravenous Contrast Given 10/20/19 1938)    Pertinent labs & imaging results that were available during my care of the patient were reviewed by me and considered in my medical decision making (see chart for details).  Rachel Newman was evaluated in Emergency Department on 10/20/2019 for the symptoms described in the history of present illness. She was evaluated in the context of the global COVID-19 pandemic, which necessitated consideration that the patient might be at risk for infection with the SARS-CoV-2 virus that causes COVID-19. Institutional protocols and algorithms that pertain to the evaluation of patients at risk for COVID-19 are in a state of rapid change based on information released by regulatory bodies including the CDC and federal and state organizations. These policies and algorithms were followed during the patient's care in the ED.     Clinical Course as of Oct 19 2026  Sat Oct 20, 2019  1610 Patient presents with central chest pain associated with upper abdominal pain radiating to the back.  Sudden onset earlier today.  EKG, chest x-ray, labs, exam are all normal.  However, with her sudden onset of radiating chest pain, associated with an initial neuro symptom and the patient reporting that she is being worked up for a autoimmune disorder, this raises the possibility of aortic dissection.  Discussed with the patient the possibility obtaining CT scan today which she agrees.  She is scheduled for an upcoming CT scan to evaluate an abdominal mass which will likely be adequately evaluated on this study   [PS]  2023 CT chest abdomen pelvis angiogram  negative for dissection or other acute findings.  Patient remains calm and comfortable in the treatment bed.  Stable for discharge home.   [PS]    Clinical Course User Index [PS] Carrie Mew, MD     ____________________________________________   FINAL CLINICAL IMPRESSION(S) / ED DIAGNOSES    Final diagnoses:  Nonspecific chest pain     ED Discharge Orders    None      Portions of this note were generated with dragon dictation software. Dictation errors may occur despite best attempts at proofreading.   Carrie Mew, MD 10/20/19 2027

## 2019-11-29 ENCOUNTER — Other Ambulatory Visit (HOSPITAL_COMMUNITY): Payer: Self-pay | Admitting: Gastroenterology

## 2019-11-29 ENCOUNTER — Other Ambulatory Visit: Payer: Self-pay | Admitting: Gastroenterology

## 2019-11-29 DIAGNOSIS — R1011 Right upper quadrant pain: Secondary | ICD-10-CM

## 2019-12-12 ENCOUNTER — Encounter (HOSPITAL_COMMUNITY): Payer: Self-pay

## 2019-12-12 ENCOUNTER — Encounter (HOSPITAL_COMMUNITY): Payer: PRIVATE HEALTH INSURANCE

## 2019-12-21 ENCOUNTER — Ambulatory Visit (HOSPITAL_COMMUNITY)
Admission: RE | Admit: 2019-12-21 | Discharge: 2019-12-21 | Disposition: A | Discharge status: Home / Self Care | Payer: 59 | Source: Ambulatory Visit | Attending: Gastroenterology | Admitting: Gastroenterology

## 2019-12-21 ENCOUNTER — Encounter (HOSPITAL_COMMUNITY): Payer: Self-pay

## 2019-12-21 ENCOUNTER — Other Ambulatory Visit: Payer: Self-pay

## 2019-12-21 DIAGNOSIS — R1011 Right upper quadrant pain: Secondary | ICD-10-CM

## 2020-01-15 ENCOUNTER — Other Ambulatory Visit: Payer: Self-pay | Admitting: Surgery

## 2020-01-17 ENCOUNTER — Emergency Department
Admission: EM | Admit: 2020-01-17 | Discharge: 2020-01-17 | Disposition: A | Discharge status: Home / Self Care | Payer: 59 | Source: Home / Self Care | Attending: Family Medicine | Admitting: Family Medicine

## 2020-01-17 ENCOUNTER — Encounter: Payer: Self-pay | Admitting: Emergency Medicine

## 2020-01-17 ENCOUNTER — Other Ambulatory Visit: Payer: Self-pay

## 2020-01-17 DIAGNOSIS — R202 Paresthesia of skin: Secondary | ICD-10-CM

## 2020-01-17 LAB — POCT CBC W AUTO DIFF (K'VILLE URGENT CARE)

## 2020-01-17 NOTE — Discharge Instructions (Addendum)
Recommend obtaining second Moderna COVID19 vaccine.

## 2020-01-17 NOTE — ED Provider Notes (Signed)
Rachel Newman CARE    CSN: [id number] Arrival date & time: 01/17/20  5176      History   Chief Complaint Chief Complaint  Patient presents with  . Arm Pain    HPI Rachel Newman is a 39 y.o. female.   Patient complains of onset of vague mild tingling sensation in her left finger tips and arm after eating dinner last night.  She also noted a vague abnormal sensation in her left leg.  The symptoms lasted about two hours.  Today she felt slightly light-headed but otherwise feels well. She reports that she had similar symptoms on Oct 20, 2019, having been thoroughly evaluated at the [facility] ED with negative EKG, chest X-ray, CT angio chst/abdomen/pelvis and routine lab work. She reports that she had her first [place] vaccination on 01 Nov 2019.  On January 10 she developed a COVID19-like illness from which she recovered without incident.  On 12/27/19 she tested positive for COVID19 IgG antibodies.  She has not yet had the second [place] vaccination.   The history is provided by the patient.    Past Medical History:  Diagnosis Date  . Herpes   . Hypertension     Active problems:  hypertension and iron deficiency  Surgical history:  none  OB History    Gravida 1      Home Medications    Prior to Admission medications   Medication Sig Start Date End Date Taking? Authorizing Provider  calcium-vitamin D (OSCAL WITH D) 500-200 MG-UNIT tablet Take 1 tablet by mouth.   Yes [provider]  Iron-Vitamin C (IRON 100/C) 100-250 MG TABS Take by mouth.   Yes [provider]  acetaminophen (TYLENOL) 325 MG tablet Take 325-650 mg by mouth every 6 (six) hours as needed for mild pain or headache.    [provider]  hydrochlorothiazide (HYDRODIURIL) 12.5 MG tablet Take 12.5 mg by mouth daily.    [provider]  ibuprofen (ADVIL,MOTRIN) 200 MG tablet Take 200-400 mg by mouth every 6 (six) hours as needed for headache  or mild pain.    [provider]    Family History Family History  Problem Relation Age of Onset  . Hypertension Other   . Diabetes Other   . Hypertension Mother   . Heart failure Mother     Social History Social History   Tobacco Use  . Smoking status: Never Smoker  . Smokeless tobacco: Never Used  Substance Use Topics  . Alcohol use: No  . Drug use: No     Allergies   Patient has no known allergies.   Review of Systems Review of Systems  Constitutional: Negative for activity change, appetite change, chills, diaphoresis, fatigue, fever and unexpected weight change.  HENT: Negative.   Eyes: Negative.   Respiratory: Negative.   Cardiovascular: Negative.   Gastrointestinal: Negative.   Genitourinary: Negative.   Musculoskeletal: Negative.   Skin: Negative.   Neurological: Positive for dizziness and light-headedness. Negative for tremors, seizures, syncope, facial asymmetry, speech difficulty, weakness, numbness and headaches.  Hematological: Negative.      Physical Exam Triage Vital Signs ED Triage Vitals [01/17/20 0943]  Enc Vitals Group     BP      Pulse      Resp      Temp      Temp src      SpO2      Weight 225 lb (102.1 kg)     Height 5'  9" (1.753 m)     Head Circumference      Peak Flow      Pain Score 0     Pain Loc      Pain Edu?      Excl. in GC?    No data found.  Updated Vital Signs Ht 5\' 9"  (1.753 m)   Wt 102.1 kg   LMP 12/25/2019   BMI 33.23 kg/m   Visual Acuity Right Eye Distance:   Left Eye Distance:   Bilateral Distance:    Right Eye Near:   Left Eye Near:    Bilateral Near:     Physical Exam Nursing notes and Vital Signs reviewed. Appearance:  Patient appears stated age, and in no acute distress Eyes:  Pupils are equal, round, and reactive to light and accomodation.  Extraocular movement is intact.  Conjunctivae are not inflamed  Ears:  Canals normal.  Tympanic membranes normal.  Nose:  Normal turbinates.   No sinus tenderness.    Pharynx:  Normal Neck:  Supple.  Shotty lateral nodes, mildly tender on the left.  No thyromegaly.  Carotids have normal upstrokes.  Lungs:  Clear to auscultation.  Breath sounds are equal.  Moving air well. Heart:  Regular rate and rhythm without murmurs, rubs, or gallops.  Abdomen:  Nontender without masses or hepatosplenomegaly.  Bowel sounds are present.  No CVA or flank tenderness.  Extremities:  No edema.  Skin:  No rash present.  Neurologic:  Cranial nerves 2 through 12 are normal.  Patellar, achilles, and elbow reflexes are normal.  Cerebellar function is intact (finger-to-nose and rapid alternating hand movement).  Gait and station are normal.   Romberg negative.   UC Treatments / Results  Labs (all labs ordered are listed, but only abnormal results are displayed) Labs Reviewed  POCT CBC W AUTO DIFF ([facility]):  WBC 6.7; LY 17.8; MO 4.2; GR 78.0; Hgb 13.1; Platelets 287     EKG   Radiology No results found.  Procedures Procedures (including critical care time)  Medications Ordered in UC Medications - No data to display  Initial Impression / Assessment and Plan / UC Course  I have reviewed the triage vital signs and the nursing notes.  Pertinent labs & imaging results that were available during my care of the patient were reviewed by me and considered in my medical decision making (see chart for details).    Review of records reveals that patient has a history of iron deficiency without anemia.  Vitamin B12 and folate levels were normal on 11/16/19. Unremarkable physical exam (including normal neurologic exam) today.  ?COVID19 sequelae.  Recommend follow-up with neurologist   Final Clinical Impressions(s) / UC Diagnoses   Final diagnoses:  Paresthesia of left arm and leg     Discharge Instructions     Recommend obtaining second Moderna COVID19 vaccine.    ED Prescriptions    None        11/18/19, MD 01/17/20  1212

## 2020-01-17 NOTE — ED Triage Notes (Signed)
Left arm and left leg pins and needles, tingling sensation for a couple of hours before falling asleep. Not in pain, she does have a gall stone,which needs to come out

## 2020-01-22 ENCOUNTER — Emergency Department (HOSPITAL_BASED_OUTPATIENT_CLINIC_OR_DEPARTMENT_OTHER): Payer: 59

## 2020-01-22 ENCOUNTER — Other Ambulatory Visit: Payer: Self-pay

## 2020-01-22 ENCOUNTER — Emergency Department (HOSPITAL_BASED_OUTPATIENT_CLINIC_OR_DEPARTMENT_OTHER)
Admission: EM | Admit: 2020-01-22 | Discharge: 2020-01-22 | Disposition: A | Discharge status: Home / Self Care | Payer: 59 | Attending: Emergency Medicine | Admitting: Emergency Medicine

## 2020-01-22 ENCOUNTER — Encounter (HOSPITAL_BASED_OUTPATIENT_CLINIC_OR_DEPARTMENT_OTHER): Payer: Self-pay | Admitting: *Deleted

## 2020-01-22 DIAGNOSIS — R202 Paresthesia of skin: Secondary | ICD-10-CM | POA: Insufficient documentation

## 2020-01-22 DIAGNOSIS — R519 Headache, unspecified: Secondary | ICD-10-CM | POA: Diagnosis not present

## 2020-01-22 DIAGNOSIS — I10 Essential (primary) hypertension: Secondary | ICD-10-CM | POA: Diagnosis not present

## 2020-01-22 DIAGNOSIS — Z7902 Long term (current) use of antithrombotics/antiplatelets: Secondary | ICD-10-CM | POA: Insufficient documentation

## 2020-01-22 NOTE — Discharge Instructions (Addendum)
Follow-up with your neurologist and your primary care doctor.  Return to the ED if symptoms worsen as discussed.

## 2020-01-22 NOTE — ED Provider Notes (Signed)
[facility] EMERGENCY DEPARTMENT Provider Note   CSN: [id number] Arrival date & time: 01/22/20  1726     History Chief Complaint  Patient presents with  . Tingling    Rachel Newman is a 39 y.o. female.  The history is provided by the patient.  Neurologic Problem This is a new problem. The current episode started 3 to 5 hours ago. The problem occurs rarely. The problem has been rapidly improving. Associated symptoms include headaches. Pertinent negatives include no chest pain, no abdominal pain and no shortness of breath. Nothing aggravates the symptoms. The symptoms are relieved by NSAIDs. The treatment provided mild relief.       Past Medical History:  Diagnosis Date  . Herpes   . Hypertension     There are no problems to display for this patient.   History reviewed. No pertinent surgical history.   OB History   No obstetric history on file.     Family History  Problem Relation Age of Onset  . Hypertension Other   . Diabetes Other   . Hypertension Mother   . Heart failure Mother     Social History   Tobacco Use  . Smoking status: Never Smoker  . Smokeless tobacco: Never Used  Substance Use Topics  . Alcohol use: No  . Drug use: No    Home Medications Prior to Admission medications   Medication Sig Start Date End Date Taking? Authorizing Provider  calcium-vitamin D (OSCAL WITH D) 500-200 MG-UNIT tablet Take 1 tablet by mouth.   Yes [provider]  hydrochlorothiazide (HYDRODIURIL) 12.5 MG tablet Take 12.5 mg by mouth daily.   Yes [provider]  ibuprofen (ADVIL,MOTRIN) 200 MG tablet Take 200-400 mg by mouth every 6 (six) hours as needed for headache or mild pain.   Yes [provider]  Iron-Vitamin C (IRON 100/C) 100-250 MG TABS Take by mouth.   Yes [provider]  acetaminophen (TYLENOL) 325 MG tablet Take 325-650 mg by mouth every 6 (six) hours as needed for mild pain or headache.    [provider]    Allergies    Patient has no known allergies.  Review of Systems   Review of Systems  Constitutional: Negative for chills and fever.  HENT: Negative for ear pain and sore throat.   Eyes: Negative for pain and visual disturbance.  Respiratory: Negative for cough and shortness of breath.   Cardiovascular: Negative for chest pain and palpitations.  Gastrointestinal: Negative for abdominal pain and vomiting.  Genitourinary: Negative for dysuria and hematuria.  Musculoskeletal: Negative for arthralgias and back pain.  Skin: Negative for color change and rash.  Neurological: Positive for headaches. Negative for seizures and syncope.  All other systems reviewed and are negative.   Physical Exam Updated Vital Signs BP 140/84 (BP Location: Right Arm)   Pulse 70   Temp 98.7 F (37.1 C) (Oral)   Resp 14   Ht 5\' 9"  (1.753 m)   Wt 101.2 kg   LMP 01/22/2020   SpO2 100%   BMI 32.93 kg/m   Physical Exam Vitals and nursing note reviewed.  Constitutional:      General: She is not in acute distress.    Appearance: She is well-developed. She is not ill-appearing.  HENT:     Head: Normocephalic and atraumatic.     Nose: Nose normal.     Mouth/Throat:     Mouth: Mucous membranes are moist.  Eyes:     Extraocular  Movements: Extraocular movements intact.     Conjunctiva/sclera: Conjunctivae normal.     Pupils: Pupils are equal, round, and reactive to light.  Cardiovascular:     Rate and Rhythm: Normal rate and regular rhythm.     Pulses: Normal pulses.     Heart sounds: Normal heart sounds. No murmur.  Pulmonary:     Effort: Pulmonary effort is normal. No respiratory distress.     Breath sounds: Normal breath sounds.  Abdominal:     Palpations: Abdomen is soft.     Tenderness: There is no abdominal tenderness.  Musculoskeletal:     Cervical back: Neck supple.  Skin:    General: Skin is warm and dry.     Capillary Refill: Capillary refill takes less than 2  seconds.     Findings: No rash.  Neurological:     General: No focal deficit present.     Mental Status: She is alert and oriented to person, place, and time.     Cranial Nerves: No cranial nerve deficit.     Sensory: No sensory deficit.     Motor: No weakness.     Coordination: Coordination normal.     Gait: Gait normal.     Comments: 5+ out of 5 strength throughout, normal sensation, no drift, normal finger-to-nose finger, normal gait, normal speech     ED Results / Procedures / Treatments   Labs (all labs ordered are listed, but only abnormal results are displayed) Labs Reviewed  PREGNANCY, URINE    EKG None  Radiology CT Head Wo Contrast  Result Date: 01/22/2020 CLINICAL DATA:  39 year old female with headache. EXAM: CT HEAD WITHOUT CONTRAST TECHNIQUE: Contiguous axial images were obtained from the base of the skull through the vertex without intravenous contrast. COMPARISON:  None. FINDINGS: Brain: No evidence of acute infarction, hemorrhage, hydrocephalus, extra-axial collection or mass lesion/mass effect. Vascular: No hyperdense vessel or unexpected calcification. Skull: Normal. Negative for fracture or focal lesion. Sinuses/Orbits: No acute finding. Other: None IMPRESSION: Normal noncontrast CT of the brain. Electronically Signed   By: Elgie Collard M.D.   On: 01/22/2020 18:22    Procedures Procedures (including critical care time)  Medications Ordered in ED Medications - No data to display  ED Course  I have reviewed the triage vital signs and the nursing notes.  Pertinent labs & imaging results that were available during my care of the patient were reviewed by me and considered in my medical decision making (see chart for details).    MDM Rules/Calculators/A&P                      Rachel Newman is a 39 year old female history of migraines who presents to the ED with tingling in her left lower leg.  Patient with normal vitals.  No fever.  Patient with symptoms  on and off like this since last week.  She had left upper arm paresthesias last week.  Had some left lower leg paresthesias this afternoon.  They have been associated with mild headaches.  She actually saw neurology recently and they are beginning the work-up.  She has no history of MS in the family.  She has had no weakness, speech changes, vision changes.  She has a normal neurological exam.  She has paresthesia feeling to the left lateral leg but normal sensation to touch.  No back pain.  No urinary symptoms.  She has mild headache that she states has gotten better with Motrin.  No history  of these type of feelings with her prior headaches.  However she states she has been having more frequent headaches since the beginning of the year.  Shared decision was made to get a head CT as patient has not had any imaging during this time.  Primary care doctor is also having her worked up for sleep apnea as a possible cause of this as well.  Head CT is unremarkable.  At this time no concern for stroke or TIA.  Suspect likely complex migraines.  However encouraged follow-up with neurology and her primary care doctor.  She is given return precautions and discharged from the ED in good condition.  This chart was dictated using voice recognition software.  Despite best efforts to proofread,  errors can occur which can change the documentation meaning.    Final Clinical Impression(s) / ED Diagnoses Final diagnoses:  Tingling    Rx / DC Orders ED Discharge Orders    None       Lennice Sites, DO 01/22/20 1950

## 2020-01-22 NOTE — ED Triage Notes (Signed)
Last week she had tingling in her left leg and left arm. She was seen at [facility] and she was told to follow up with her neurologist.  2 hours ago she started having tingling in her left leg. She is ambulatory.

## 2020-06-21 IMAGING — CT CT HEAD W/O CM
3 series · 15 of 47 positions shown, 18 images · non-contrast
Comparison: None.

CLINICAL DATA: 38-year-old female with headache.

EXAM:
CT HEAD WITHOUT CONTRAST
TECHNIQUE: Contiguous axial images were obtained from the base of the skull
through the vertex without intravenous contrast.

[Series 2: head wo · axial · 0.44mm/px · z∈[+984,+1109]mm · 9 of 31 slices shown, 12 images]
[im 3/31  brain]
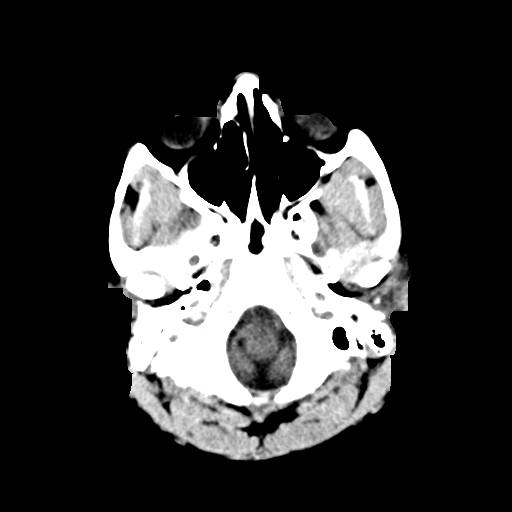
[im 3/31  bone]
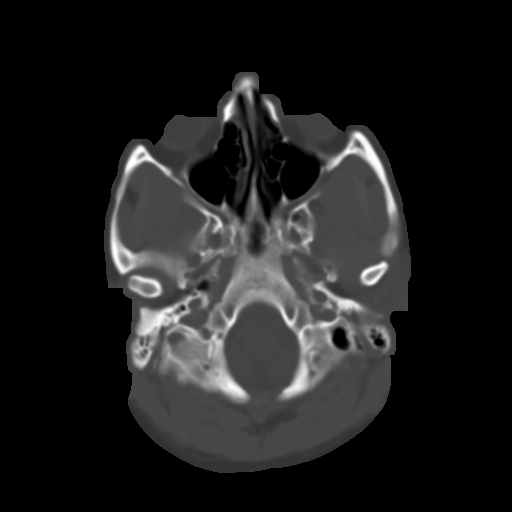
[im 6/31  brain]
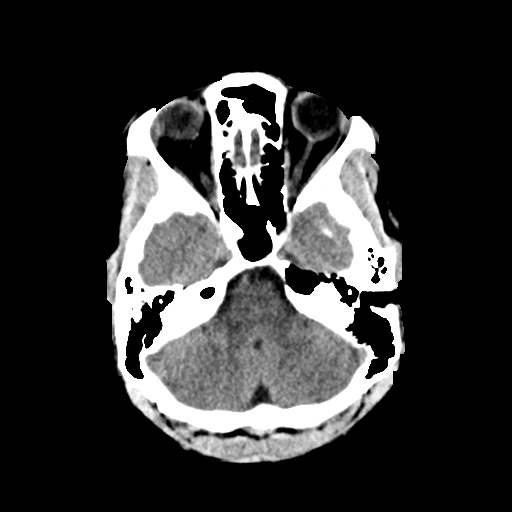
[im 9/31  brain]
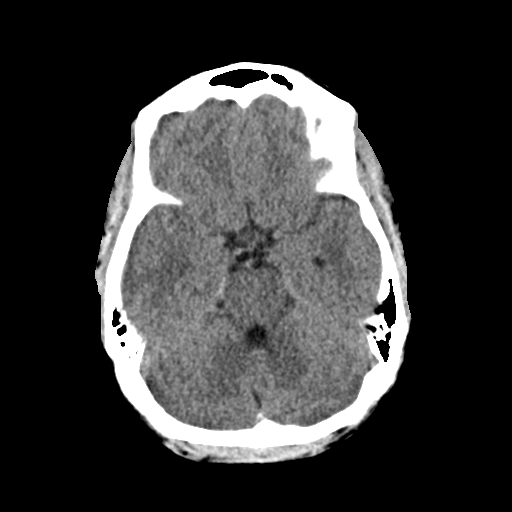
[im 12/31  brain]
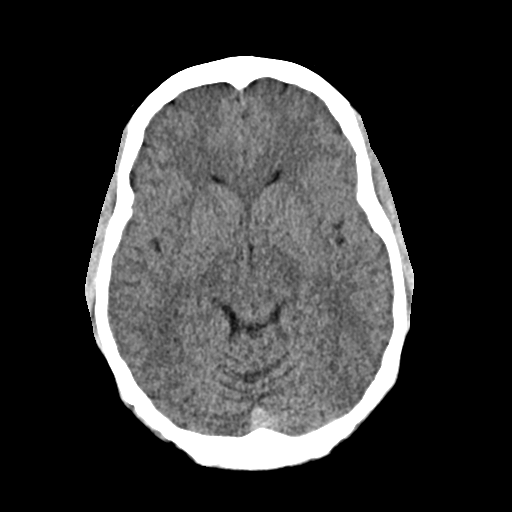
[im 16/31  brain]
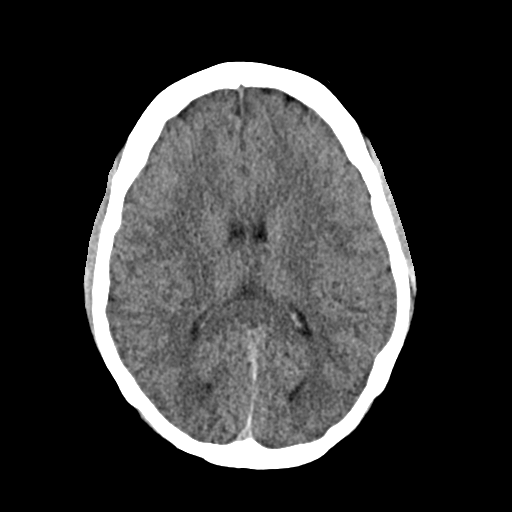
[im 16/31  bone]
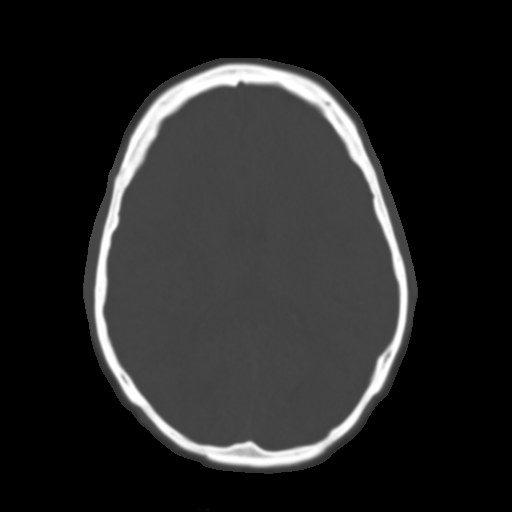
[im 19/31  brain]
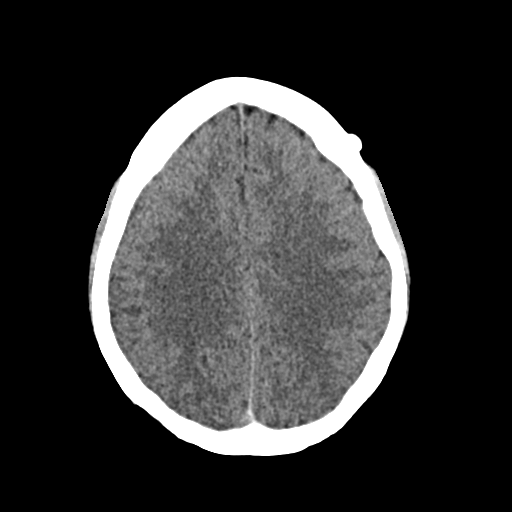
[im 22/31  brain]
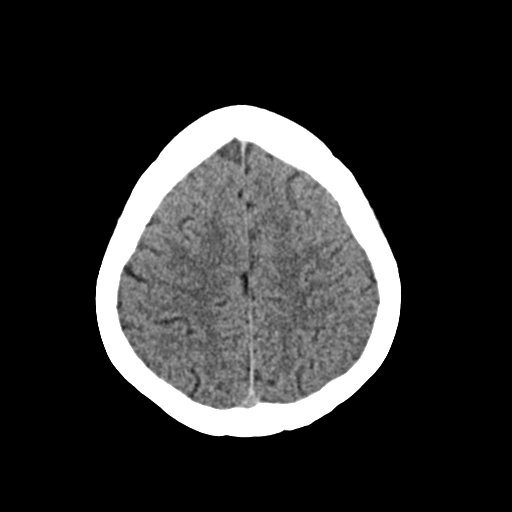
[im 25/31  brain]
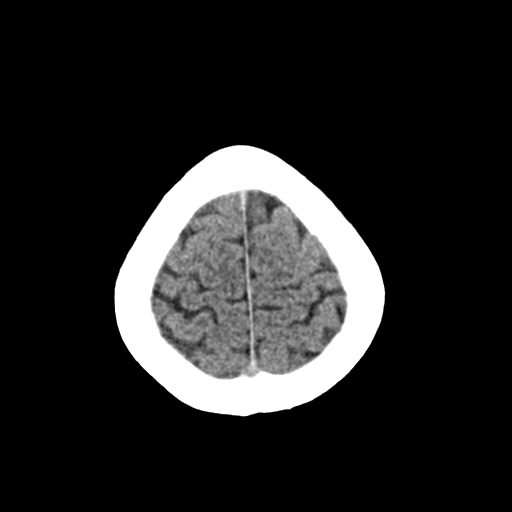
[im 28/31  brain]
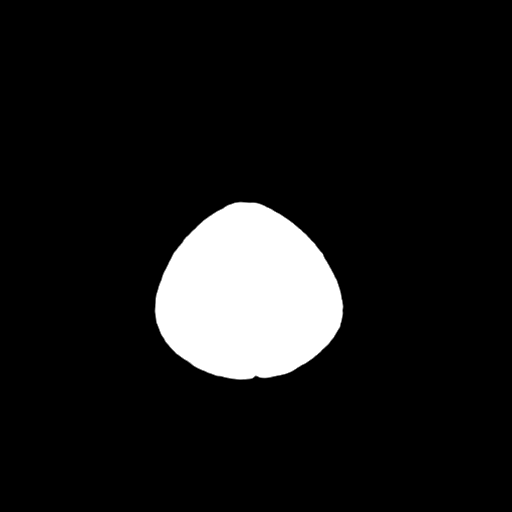
[im 28/31  bone]
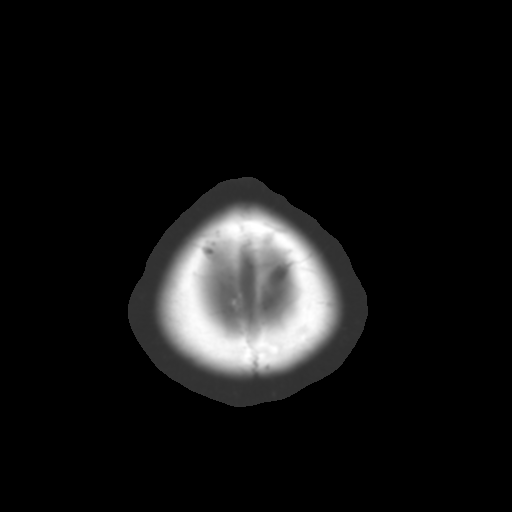

[Series 4: coronal soft · coronal · 0.33mm/px · 3 of 67 slices shown]
[im 23/67  brain]
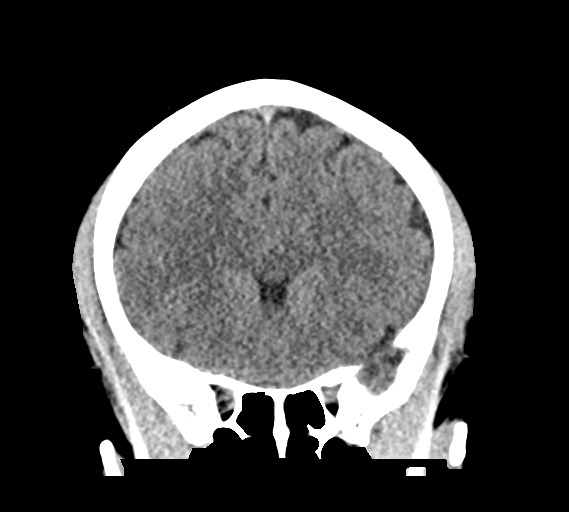
[im 30/67  brain]
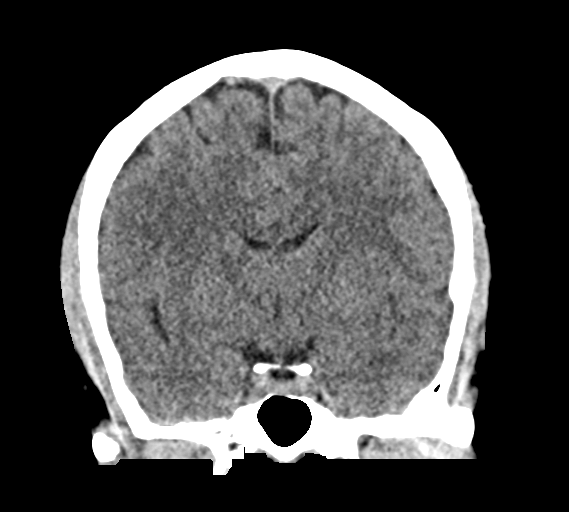
[im 37/67  brain]
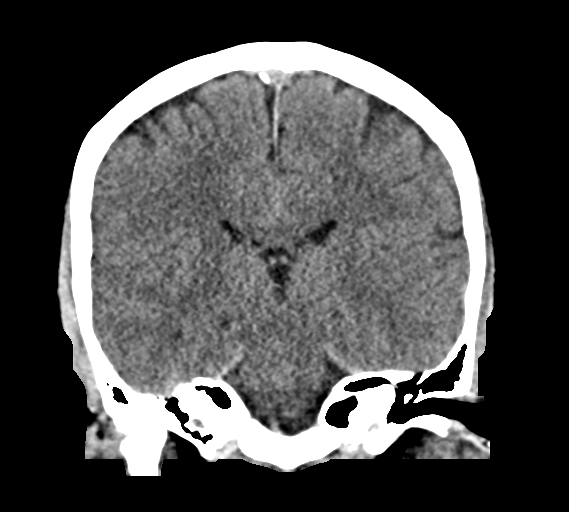

[Series 5: sag soft · sagittal · 0.30mm/px · 3 of 64 slices shown]
[im 22/64  brain]
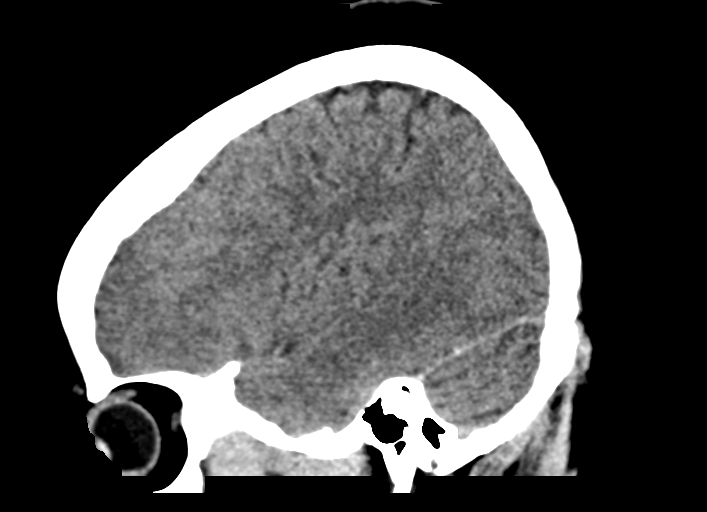
[im 32/64  brain]
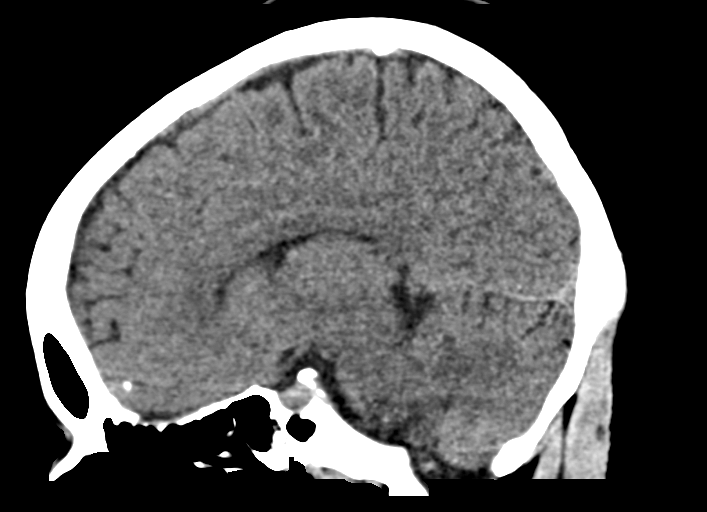
[im 43/64  brain]
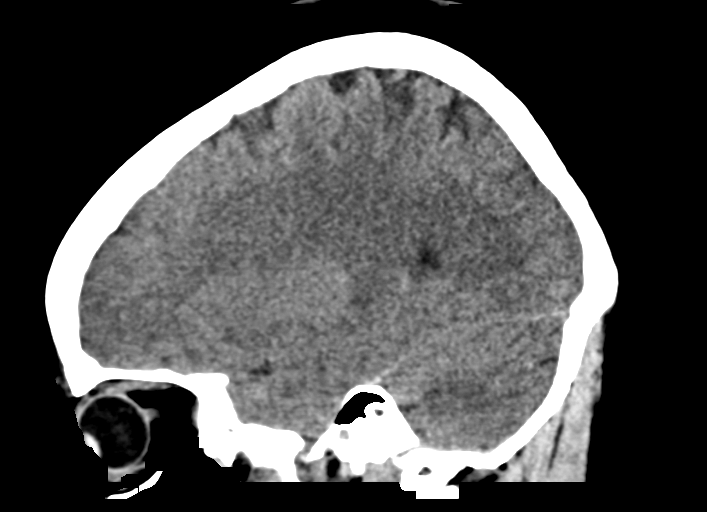

[15 of 47 positions shown; findings below may reference images not displayed]

FINDINGS: Brain: No evidence of acute infarction, hemorrhage, hydrocephalus,
extra-axial collection or mass lesion/mass effect.

Vascular: No hyperdense vessel or unexpected calcification.

Skull: Normal. Negative for fracture or focal lesion.

Sinuses/Orbits: No acute finding.

Other: None
IMPRESSION: Normal noncontrast CT of the brain.

## 2021-10-02 ENCOUNTER — Encounter (HOSPITAL_BASED_OUTPATIENT_CLINIC_OR_DEPARTMENT_OTHER): Payer: Self-pay

## 2021-10-02 ENCOUNTER — Emergency Department (HOSPITAL_BASED_OUTPATIENT_CLINIC_OR_DEPARTMENT_OTHER): Payer: 59

## 2021-10-02 ENCOUNTER — Other Ambulatory Visit: Payer: Self-pay

## 2021-10-02 ENCOUNTER — Emergency Department (HOSPITAL_BASED_OUTPATIENT_CLINIC_OR_DEPARTMENT_OTHER)
Admission: EM | Admit: 2021-10-02 | Discharge: 2021-10-03 | Disposition: A | Discharge status: Home / Self Care | Payer: 59 | Attending: Emergency Medicine | Admitting: Emergency Medicine

## 2021-10-02 DIAGNOSIS — Z79899 Other long term (current) drug therapy: Secondary | ICD-10-CM | POA: Diagnosis not present

## 2021-10-02 DIAGNOSIS — I10 Essential (primary) hypertension: Secondary | ICD-10-CM | POA: Insufficient documentation

## 2021-10-02 DIAGNOSIS — R0789 Other chest pain: Secondary | ICD-10-CM

## 2021-10-02 HISTORY — DX: Migraine, unspecified, not intractable, without status migrainosus: G43.909

## 2021-10-02 HISTORY — DX: Anemia, unspecified: D64.9

## 2021-10-02 LAB — BASIC METABOLIC PANEL
Anion gap: 7 (ref 5–15)
BUN: 10 mg/dL (ref 6–20)
CO2: 29 mmol/L (ref 22–32)
Calcium: 9 mg/dL (ref 8.9–10.3)
Chloride: 102 mmol/L (ref 98–111)
Creatinine, Ser: 0.74 mg/dL (ref 0.44–1.00)
GFR, Estimated: >60 mL/min (ref >60)
Glucose, Bld: 101 mg/dL — ABNORMAL HIGH (ref 70–99)
Potassium: 3.7 mmol/L (ref 3.5–5.1)
Sodium: 138 mmol/L (ref 135–145)

## 2021-10-02 LAB — CBC
HCT: 40.7 % (ref 36.0–46.0)
Hemoglobin: 12.7 g/dL (ref 12.0–15.0)
MCH: 23 pg — ABNORMAL LOW (ref 26.0–34.0)
MCHC: 31.2 g/dL (ref 30.0–36.0)
MCV: 73.9 fL — ABNORMAL LOW (ref 80.0–100.0)
Platelets: 269 10*3/uL (ref 150–400)
RBC: 5.51 MIL/uL — ABNORMAL HIGH (ref 3.87–5.11)
RDW: 14.6 % (ref 11.5–15.5)
WBC: 8.6 10*3/uL (ref 4.0–10.5)
nRBC: 0 % (ref 0.0–0.2)

## 2021-10-02 LAB — PREGNANCY, URINE: Preg Test, Ur: NEGATIVE

## 2021-10-02 LAB — TROPONIN I (HIGH SENSITIVITY): Troponin I (High Sensitivity): <2 ng/L (ref <18)

## 2021-10-02 MED ORDER — LIDOCAINE VISCOUS HCL 2 % MT SOLN
15.0000 mL | Freq: Once | OROMUCOSAL | Status: AC
  Administered 2021-10-02: 15 mL via ORAL
  Filled 2021-10-02: qty 15

## 2021-10-02 MED ORDER — ALUM & MAG HYDROXIDE-SIMETH 200-200-20 MG/5ML PO SUSP
30.0000 mL | Freq: Once | ORAL | Status: AC
  Administered 2021-10-02: 30 mL via ORAL
  Filled 2021-10-02: qty 30

## 2021-10-02 NOTE — ED Triage Notes (Signed)
Pt c/o CP x 2 days-denies fever/flu sx-NAD-steady gait 

## 2021-10-02 NOTE — ED Provider Notes (Signed)
[facility] EMERGENCY DEPARTMENT Provider Note   CSN: [id number] Arrival date & time: 10/02/21  1919     History Chief Complaint  Patient presents with   Chest Pain    Rachel Newman is a 40 y.o. female.  Patient is a 40 year old female with past medical history of hypertension, GERD, and anemia.  Patient presenting today with complaints of chest discomfort.  She reports a 2-day history of what she describes as a "burning" sensation to the center of her chest.  This has been intermittent during this period of time.  She denies any shortness of breath, nausea, diaphoresis, or radiation of the discomfort.  There are no aggravating factors and no alleviating factors.  She denies any exertional symptoms.  She reports having seen a cardiologist nearly 2 years ago, but with negative work-up.  Only cardiac risk factors include hypertension.  She had been on Dexilant in the past for reflux disease, however this was stopped approximately 1 year ago.  She tried taking a dose of this medication this evening, but with no relief.  The history is provided by the patient.  Chest Pain Pain location:  Substernal area Pain quality: burning   Pain radiates to:  Does not radiate Pain severity:  Moderate Onset quality:  Sudden Duration:  2 days Timing:  Intermittent Progression:  Unchanged Chronicity:  New Relieved by:  Nothing Worsened by:  Nothing Ineffective treatments: Dexilant.     Past Medical History:  Diagnosis Date   Anemia    Herpes    Hypertension    Migraine     There are no problems to display for this patient.   Past Surgical History:  Procedure Laterality Date   CHOLECYSTECTOMY       OB History   No obstetric history on file.     Family History  Problem Relation Age of Onset   Hypertension Other    Diabetes Other    Hypertension Mother    Heart failure Mother     Social History   Tobacco Use   Smoking status: Never   Smokeless tobacco: Never   Vaping Use   Vaping Use: Never used  Substance Use Topics   Alcohol use: Yes    Comment: occ   Drug use: No    Home Medications Prior to Admission medications   Medication Sig Start Date End Date Taking? Authorizing Provider  acetaminophen (TYLENOL) 325 MG tablet Take 325-650 mg by mouth every 6 (six) hours as needed for mild pain or headache.    [provider]  calcium-vitamin D (OSCAL WITH D) 500-200 MG-UNIT tablet Take 1 tablet by mouth.    [provider]  hydrochlorothiazide (HYDRODIURIL) 12.5 MG tablet Take 12.5 mg by mouth daily.    [provider]  ibuprofen (ADVIL,MOTRIN) 200 MG tablet Take 200-400 mg by mouth every 6 (six) hours as needed for headache or mild pain.    [provider]  Iron-Vitamin C (IRON 100/C) 100-250 MG TABS Take by mouth.    [provider]    Allergies    Tape  Review of Systems   Review of Systems  Cardiovascular:  Positive for chest pain.  All other systems reviewed and are negative.  Physical Exam Updated Vital Signs BP (!) 119/94   Pulse 86   Temp 98.2 F (36.8 C) (Oral)   Resp 16   Ht 5\' 9"  (1.753 m)   Wt 106.1 kg   LMP 09/09/2021   SpO2 100%  BMI 34.56 kg/m   Physical Exam Vitals and nursing note reviewed.  Constitutional:      General: She is not in acute distress.    Appearance: She is well-developed. She is not diaphoretic.  HENT:     Head: Normocephalic and atraumatic.  Cardiovascular:     Rate and Rhythm: Normal rate and regular rhythm.     Heart sounds: No murmur heard.   No friction rub. No gallop.  Pulmonary:     Effort: Pulmonary effort is normal. No respiratory distress.     Breath sounds: Normal breath sounds. No wheezing.  Abdominal:     General: Bowel sounds are normal. There is no distension.     Palpations: Abdomen is soft.     Tenderness: There is no abdominal tenderness.  Musculoskeletal:        General: Normal range of motion.     Cervical back: Normal  range of motion and neck supple.     Right lower leg: No tenderness. No edema.     Left lower leg: No tenderness. No edema.  Skin:    General: Skin is warm and dry.  Neurological:     General: No focal deficit present.     Mental Status: She is alert and oriented to person, place, and time.    ED Results / Procedures / Treatments   Labs (all labs ordered are listed, but only abnormal results are displayed) Labs Reviewed  BASIC METABOLIC PANEL - Abnormal; Notable for the following components:      Result Value   Glucose, Bld 101 (*)    All other components within normal limits  CBC - Abnormal; Notable for the following components:   RBC 5.51 (*)    MCV 73.9 (*)    MCH 23.0 (*)    All other components within normal limits  PREGNANCY, URINE  TROPONIN I (HIGH SENSITIVITY)  TROPONIN I (HIGH SENSITIVITY)    EKG EKG Interpretation  Date/Time:  Friday October 02 2021 19:31:10 EST Ventricular Rate:  94 PR Interval:  160 QRS Duration: 72 QT Interval:  332 QTC Calculation: 415 R Axis:   52 Text Interpretation: Normal sinus rhythm Septal infarct , age undetermined Abnormal ECG Unchanged from 10/20/2019 Confirmed by Geoffery Lyons ([id number]) on 10/02/2021 11:18:17 PM  Radiology DG Chest 2 View  Result Date: 10/02/2021 CLINICAL DATA:  Chest pain EXAM: CHEST - 2 VIEW COMPARISON:  2020 FINDINGS: The heart size and mediastinal contours are within normal limits. Both lungs are clear. No pleural effusion or pneumothorax. The visualized skeletal structures are unremarkable. IMPRESSION: No active cardiopulmonary disease. Electronically Signed   By: Guadlupe Spanish M.D.   On: 10/02/2021 20:15    Procedures Procedures   Medications Ordered in ED Medications  alum & mag hydroxide-simeth (MAALOX/MYLANTA) 200-200-20 MG/5ML suspension 30 mL (has no administration in time range)    And  lidocaine (XYLOCAINE) 2 % viscous mouth solution 15 mL (has no administration in time range)    ED Course  I  have reviewed the triage vital signs and the nursing notes.  Pertinent labs & imaging results that were available during my care of the patient were reviewed by me and considered in my medical decision making (see chart for details).    MDM Rules/Calculators/A&P  Patient presenting here with complaints of chest discomfort as described in the HPI.  This is described as a burning sensation.  I suspect a noncardiac etiology as her symptoms are atypical and work-up including EKG and troponin  x2 is unremarkable despite symptoms for the past 3 days.  Only cardiac risk factors are hypertension.  Patient given GI cocktail with some relief.  At this point, I feel as though patient can safely be discharged.  She had previously been on Dexilant, however was taken off of this medication many months ago.  She has some of this at home and will restart it and see if this helps.  She is to return as needed if symptoms worsen or change.  Final Clinical Impression(s) / ED Diagnoses Final diagnoses:  None    Rx / DC Orders ED Discharge Orders     None        Geoffery Lyons, MD 10/03/21 0111

## 2021-10-03 LAB — TROPONIN I (HIGH SENSITIVITY): Troponin I (High Sensitivity): <2 ng/L (ref <18)

## 2021-10-03 NOTE — Discharge Instructions (Signed)
Resume taking Dexilant as before.  Follow-up with primary doctor if not improving in the next week, and return to the ER if you develop severe chest pain, difficulty breathing, or other new and concerning symptoms.

## 2022-01-06 ENCOUNTER — Other Ambulatory Visit: Payer: Self-pay | Admitting: Nurse Practitioner

## 2022-01-06 DIAGNOSIS — N644 Mastodynia: Secondary | ICD-10-CM

## 2022-01-11 ENCOUNTER — Ambulatory Visit: Payer: 59

## 2022-01-11 ENCOUNTER — Other Ambulatory Visit: Payer: Self-pay

## 2022-01-11 ENCOUNTER — Ambulatory Visit
Admission: RE | Admit: 2022-01-11 | Discharge: 2022-01-11 | Disposition: A | Discharge status: Home / Self Care | Payer: No Typology Code available for payment source | Source: Ambulatory Visit | Attending: Nurse Practitioner | Admitting: Nurse Practitioner

## 2022-01-11 DIAGNOSIS — N644 Mastodynia: Secondary | ICD-10-CM

## 2022-05-19 ENCOUNTER — Emergency Department (HOSPITAL_BASED_OUTPATIENT_CLINIC_OR_DEPARTMENT_OTHER): Payer: No Typology Code available for payment source

## 2022-05-19 ENCOUNTER — Other Ambulatory Visit: Payer: Self-pay

## 2022-05-19 ENCOUNTER — Emergency Department
Admission: EM | Admit: 2022-05-19 | Discharge: 2022-05-19 | Disposition: A | Discharge status: Home / Self Care | Payer: No Typology Code available for payment source | Attending: Family Medicine | Admitting: Family Medicine

## 2022-05-19 ENCOUNTER — Emergency Department (HOSPITAL_BASED_OUTPATIENT_CLINIC_OR_DEPARTMENT_OTHER)
Admission: EM | Admit: 2022-05-19 | Discharge: 2022-05-19 | Disposition: A | Discharge status: Home / Self Care | Payer: No Typology Code available for payment source | Attending: Emergency Medicine | Admitting: Emergency Medicine

## 2022-05-19 ENCOUNTER — Encounter (HOSPITAL_BASED_OUTPATIENT_CLINIC_OR_DEPARTMENT_OTHER): Payer: Self-pay | Admitting: Emergency Medicine

## 2022-05-19 ENCOUNTER — Encounter: Payer: Self-pay | Admitting: Emergency Medicine

## 2022-05-19 DIAGNOSIS — M7989 Other specified soft tissue disorders: Secondary | ICD-10-CM | POA: Insufficient documentation

## 2022-05-19 DIAGNOSIS — M79662 Pain in left lower leg: Secondary | ICD-10-CM

## 2022-05-19 DIAGNOSIS — R079 Chest pain, unspecified: Secondary | ICD-10-CM

## 2022-05-19 DIAGNOSIS — Z79899 Other long term (current) drug therapy: Secondary | ICD-10-CM | POA: Insufficient documentation

## 2022-05-19 DIAGNOSIS — I10 Essential (primary) hypertension: Secondary | ICD-10-CM | POA: Insufficient documentation

## 2022-05-19 DIAGNOSIS — R0602 Shortness of breath: Secondary | ICD-10-CM | POA: Insufficient documentation

## 2022-05-19 DIAGNOSIS — R0789 Other chest pain: Secondary | ICD-10-CM | POA: Diagnosis present

## 2022-05-19 LAB — TROPONIN I (HIGH SENSITIVITY): Troponin I (High Sensitivity): 2 ng/L (ref <18)

## 2022-05-19 LAB — CBC WITH DIFFERENTIAL/PLATELET
Abs Immature Granulocytes: 0.02 10*3/uL (ref 0.00–0.07)
Basophils Absolute: 0 10*3/uL (ref 0.0–0.1)
Basophils Relative: 1 %
Eosinophils Absolute: 0.1 10*3/uL (ref 0.0–0.5)
Eosinophils Relative: 1 %
HCT: 39.5 % (ref 36.0–46.0)
Hemoglobin: 12.3 g/dL (ref 12.0–15.0)
Immature Granulocytes: 0 %
Lymphocytes Relative: 28 %
Lymphs Abs: 2.3 10*3/uL (ref 0.7–4.0)
MCH: 22.7 pg — ABNORMAL LOW (ref 26.0–34.0)
MCHC: 31.1 g/dL (ref 30.0–36.0)
MCV: 72.7 fL — ABNORMAL LOW (ref 80.0–100.0)
Monocytes Absolute: 0.6 10*3/uL (ref 0.1–1.0)
Monocytes Relative: 7 %
Neutro Abs: 5.4 10*3/uL (ref 1.7–7.7)
Neutrophils Relative %: 63 %
Platelets: 266 10*3/uL (ref 150–400)
RBC: 5.43 MIL/uL — ABNORMAL HIGH (ref 3.87–5.11)
RDW: 15.6 % — ABNORMAL HIGH (ref 11.5–15.5)
WBC: 8.5 10*3/uL (ref 4.0–10.5)
nRBC: 0 % (ref 0.0–0.2)

## 2022-05-19 LAB — PREGNANCY, URINE: Preg Test, Ur: NEGATIVE

## 2022-05-19 LAB — COMPREHENSIVE METABOLIC PANEL
ALT: 20 U/L (ref 0–44)
AST: 21 U/L (ref 15–41)
Albumin: 3.6 g/dL (ref 3.5–5.0)
Alkaline Phosphatase: 69 U/L (ref 38–126)
Anion gap: 6 (ref 5–15)
BUN: 10 mg/dL (ref 6–20)
CO2: 27 mmol/L (ref 22–32)
Calcium: 9 mg/dL (ref 8.9–10.3)
Chloride: 103 mmol/L (ref 98–111)
Creatinine, Ser: 0.96 mg/dL (ref 0.44–1.00)
GFR, Estimated: >60 mL/min (ref >60)
Glucose, Bld: 87 mg/dL (ref 70–99)
Potassium: 3.5 mmol/L (ref 3.5–5.1)
Sodium: 136 mmol/L (ref 135–145)
Total Bilirubin: 0.6 mg/dL (ref 0.3–1.2)
Total Protein: 7.8 g/dL (ref 6.5–8.1)

## 2022-05-19 LAB — LIPASE, BLOOD: Lipase: 40 U/L (ref 11–51)

## 2022-05-19 LAB — BRAIN NATRIURETIC PEPTIDE: B Natriuretic Peptide: 20.1 pg/mL (ref 0.0–100.0)

## 2022-05-19 LAB — D-DIMER, QUANTITATIVE: D-Dimer, Quant: 1.35 ug/mL-FEU — ABNORMAL HIGH (ref 0.00–0.50)

## 2022-05-19 MED ORDER — IOHEXOL 350 MG/ML SOLN
100.0000 mL | Freq: Once | INTRAVENOUS | Status: AC | PRN
  Administered 2022-05-19: 100 mL via INTRAVENOUS

## 2022-05-19 NOTE — ED Triage Notes (Signed)
Pt referred here from [facility] for eval of CP; started last night under LT breast; frequently travels in car; has [facility] w/ exertion

## 2022-05-19 NOTE — ED Notes (Signed)
Client states she is currently on her menstruation cycle

## 2022-05-19 NOTE — ED Notes (Signed)
Strong Dorsal and Planter flexion bilaterally, POSITIVE Homans' SIGN in LLE

## 2022-05-19 NOTE — ED Notes (Signed)
Presents with mid sternal chest pressure, has DOE, but none at this time, bilateral ankle swelling also noted. Onset of symptoms in the past 24 hours. With chest pressure had some light headed feeling, no nausea or vomiting. Non smoker, no birth control pills. Recently long car ride to [place].

## 2022-05-19 NOTE — ED Notes (Signed)
Called [facility], to let them know she was coming

## 2022-05-19 NOTE — Discharge Instructions (Signed)
Your work-up today was very reassuring.  You were negative for blood clot in your leg and in your lungs.  The rest of your labs were all normal.  There is no evidence of heart failure or heart attack.  Please follow-up with your PCP or cardiologist if your pain continues.  Return if pain or shortness of breath suddenly worsens.

## 2022-05-19 NOTE — ED Triage Notes (Signed)
Chest pain started last night w/ DOE  Intermittent chest pain  Takes prilosec  BID - no relief No tylenol, ASA or motrin for chest pain  Denies chest injury  Left leg thigh cramps after traveling in car for 9 hours Left ankle & foot swelling since then

## 2022-05-19 NOTE — ED Provider Notes (Signed)
[facility] EMERGENCY DEPARTMENT Provider Note   CSN: [id number] Arrival date & time: 05/19/22  1748     History  Chief Complaint  Patient presents with   Chest Pain    Rachel Newman is a 41 y.o. female with history of anemia on iron supplement and hypertension who presents to the ED for evaluation of midsternal chest pain with exertional shortness of breath and left leg swelling.  Patient states that she has had chest pain on and off for some time, however yesterday she developed this dull pain substernally that seem to occasionally radiating down underneath her left breast.  No alleviating or aggravating factors.  She noticed today when walking into work, that she felt short of breath.  Also endorses occasional dry cough.  She also noted that her left leg appeared very swollen after a 9-hour drive that occurred 5 days ago.  She does have occasional swelling of her legs, however she noticed that it seemed pretty severe after her long travel.  It has come down some since then.  She is having pain behind the left calf.  She is a non-smoker and not on birth control pills.  She denies history of cardiac disease.  No personal or family history of heart failure.  She denies fever, chills, abdominal pain, nausea, vomiting and diarrhea.  She went to urgent care earlier today who referred her to the emergency department for further evaluation.   Chest Pain Associated symptoms: shortness of breath   Associated symptoms: no abdominal pain, no fever, no headache, no nausea and no vomiting        Home Medications Prior to Admission medications   Medication Sig Start Date End Date Taking? Authorizing Provider  acetaminophen (TYLENOL) 325 MG tablet Take 325-650 mg by mouth every 6 (six) hours as needed for mild pain or headache.    [provider]  ascorbic acid (VITAMIN C) 500 MG tablet Take by mouth.    [provider]  calcium-vitamin D (OSCAL WITH D) 500-200 MG-UNIT  tablet Take 1 tablet by mouth. Patient not taking: Reported on 05/19/2022    [provider]  Cholecalciferol 25 MCG (1000 UT) capsule Take by mouth.    [provider]  fluticasone (FLONASE) 50 MCG/ACT nasal spray Place 1 spray into both nostrils daily. 04/23/22   [provider]  hydrochlorothiazide (HYDRODIURIL) 12.5 MG tablet Take 12.5 mg by mouth daily.    [provider]  ibuprofen (ADVIL,MOTRIN) 200 MG tablet Take 200-400 mg by mouth every 6 (six) hours as needed for headache or mild pain.    [provider]  iron polysaccharides (NIFEREX) 150 MG capsule Take 150 mg by mouth daily. 05/13/22   [provider]  Iron-Vitamin C (IRON 100/C) 100-250 MG TABS Take by mouth.    [provider]  metoprolol succinate (TOPROL-XL) 25 MG 24 hr tablet Take 25 mg by mouth 2 (two) times daily. 05/07/22   [provider]  omeprazole (PRILOSEC) 40 MG capsule Take 40 mg by mouth daily as needed. 05/07/22   [provider]  SUMAtriptan (IMITREX) 25 MG tablet Take one tablet at the onset of migraine. May repeat in 2 hours if needed, do not exceed over 2 doses within a 24 hour period. 02/01/20   [provider]      Allergies    Tape    Review of Systems   Review of Systems  Constitutional:  Negative for fever.  Respiratory:  Positive for shortness  of breath.   Cardiovascular:  Positive for chest pain and leg swelling.  Gastrointestinal:  Negative for abdominal pain, nausea and vomiting.  Genitourinary:  Negative for dysuria and hematuria.  Neurological:  Negative for headaches.    Physical Exam Updated Vital Signs BP 126/89   Pulse 75   Temp 98.1 F (36.7 C) (Oral)   Resp 20   LMP 05/16/2022 (Exact Date)   SpO2 100%  Physical Exam Vitals and nursing note reviewed.  Constitutional:      General: She is not in acute distress.    Appearance: She is not ill-appearing.  HENT:     Head: Atraumatic.  Eyes:      Conjunctiva/sclera: Conjunctivae normal.  Cardiovascular:     Rate and Rhythm: Normal rate and regular rhythm.     Pulses: Normal pulses.          Radial pulses are 2+ on the right side and 2+ on the left side.       Dorsalis pedis pulses are 2+ on the right side and 2+ on the left side.     Heart sounds: No murmur heard. Pulmonary:     Effort: Pulmonary effort is normal. No respiratory distress.     Breath sounds: Normal breath sounds.  Abdominal:     General: Abdomen is flat. There is no distension.     Palpations: Abdomen is soft.     Tenderness: There is no abdominal tenderness.  Musculoskeletal:        General: Normal range of motion.     Cervical back: Normal range of motion.     Right lower leg: 1+ Edema present.     Left lower leg: 1+ Edema present.     Comments: Positive Homans' sign on left lower extremity.  Posterior calf tenderness on the left side.  Skin:    General: Skin is warm and dry.     Capillary Refill: Capillary refill takes less than 2 seconds.  Neurological:     General: No focal deficit present.     Mental Status: She is alert.  Psychiatric:        Mood and Affect: Mood normal.     ED Results / Procedures / Treatments   Labs (all labs ordered are listed, but only abnormal results are displayed) Labs Reviewed  CBC WITH DIFFERENTIAL/PLATELET - Abnormal; Notable for the following components:      Result Value   RBC 5.43 (*)    MCV 72.7 (*)    MCH 22.7 (*)    RDW 15.6 (*)    All other components within normal limits  D-DIMER, QUANTITATIVE - Abnormal; Notable for the following components:   D-Dimer, Quant 1.35 (*)    All other components within normal limits  COMPREHENSIVE METABOLIC PANEL  LIPASE, BLOOD  PREGNANCY, URINE  BRAIN NATRIURETIC PEPTIDE  TROPONIN I (HIGH SENSITIVITY)    EKG None  Radiology CT Angio Chest PE W and/or Wo Contrast  Result Date: 05/19/2022 CLINICAL DATA:  Pulmonary embolism suspected, positive D-dimer. Midsternal  chest pressure, dyspnea on exertion, bilateral ankle swelling. EXAM: CT ANGIOGRAPHY CHEST WITH CONTRAST TECHNIQUE: Multidetector CT imaging of the chest was performed using the standard protocol during bolus administration of intravenous contrast. Multiplanar CT image reconstructions and MIPs were obtained to evaluate the vascular anatomy. RADIATION DOSE REDUCTION: This exam was performed according to the departmental dose-optimization program which includes automated exposure control, adjustment of the mA and/or kV according to patient size and/or use of iterative reconstruction technique.  CONTRAST:  OMNIPAQUE IOHEXOL 350 MG/ML SOLN COMPARISON:  10/20/2019, 10/12/2012. FINDINGS: Cardiovascular: Heart is normal in size and there is no pericardial effusion. The pulmonary trunk and aorta are normal in caliber. No pulmonary artery filling defect is identified. Mediastinum/Nodes: No enlarged mediastinal, hilar, or axillary lymph nodes. Thyroid gland, trachea, and esophagus demonstrate no significant findings. Lungs/Pleura: Mild atelectasis is present at the left lung base. No effusion or pneumothorax. No significant pulmonary nodule. Upper Abdomen: There is a hypodensity in the left lobe of the liver measuring 2.6 cm, previously characterized as a hemangioma. No acute abnormality. Musculoskeletal: No chest wall abnormality. No acute or significant osseous findings. Review of the MIP images confirms the above findings. IMPRESSION: 1. No evidence of pulmonary embolism. 2. Minimal atelectasis at the left lung base. 3. Hypodense lesion in the left lobe of the liver, previously characterized as a hemangioma on MRI. Electronically Signed   By: Thornell Sartorius M.D.   On: 05/19/2022 20:33   US Venous Img Lower Unilateral Left  Result Date: 05/19/2022 CLINICAL DATA:  Shortness of breath EXAM: LEFT LOWER EXTREMITY VENOUS DOPPLER ULTRASOUND TECHNIQUE: Gray-scale sonography with compression, as well as color and duplex  ultrasound, were performed to evaluate the deep venous system(s) from the level of the common femoral vein through the popliteal and proximal calf veins. COMPARISON:  None Available. FINDINGS: VENOUS Normal compressibility of the common femoral, superficial femoral, and popliteal veins, as well as the visualized calf veins. Visualized portions of profunda femoral vein and great saphenous vein unremarkable. No filling defects to suggest DVT on grayscale or color Doppler imaging. Doppler waveforms show normal direction of venous flow, normal respiratory plasticity and response to augmentation. Limited views of the contralateral common femoral vein are unremarkable. OTHER None. Limitations: none IMPRESSION: Negative. Electronically Signed   By: Deatra Robinson M.D.   On: 05/19/2022 19:40   DG Chest Portable 1 View  Result Date: 05/19/2022 CLINICAL DATA:  Chest pain since last night. EXAM: PORTABLE CHEST 1 VIEW COMPARISON:  Radiographs 10/02/2021 and 10/20/2019.  CT 10/20/2019. FINDINGS: 1757 hours. The heart size and mediastinal contours are normal. The lungs are clear. There is no pleural effusion or pneumothorax. No acute osseous findings are identified. Telemetry leads overlie the chest. 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 IMPRESSION: No evidence of active cardiopulmonary process. Electronically Signed   By: Carey Bullocks M.D.   On: 05/19/2022 18:12    Procedures Procedures    Medications Ordered in ED Medications  iohexol (OMNIPAQUE) 350 MG/ML injection 100 mL (100 mLs Intravenous Contrast Given 05/19/22 2004)    ED Course/ Medical Decision Making/ A&P                           Medical Decision Making Amount and/or Complexity of Data Reviewed Labs: ordered. Radiology: ordered.  Risk Prescription drug management.   Social determinants of health:  Social History   Socioeconomic History   Marital status: Single    Spouse name: Not on file   Number of  children: Not on file   Years of education: Not on file   Highest education level: Not on file  Occupational History   Not on file  Tobacco Use   Smoking status: Never   Smokeless tobacco: Never  Vaping Use   Vaping Use: Never used  Substance and Sexual Activity   Alcohol use: Yes    Comment: occ   Drug use: No   Sexual  activity: Yes    Birth control/protection: None  Other Topics Concern   Not on file  Social History Narrative   Not on file   Social Determinants of Health   Financial Resource Strain: Not on file  Food Insecurity: Not on file  Transportation Needs: Not on file  Physical Activity: Not on file  Stress: Not on file  Social Connections: Not on file  Intimate Partner Violence: Not on file     Initial impression:  This patient presents to the ED for concern of chest pain, shortness of breath and leg swelling, this involves an extensive number of treatment options, and is a complaint that carries with it a high risk of complications and morbidity.   Differentials include ACS, PE, DVT, CHF, viral infection, pneumonia.   Comorbidities affecting care:  Hypertension, anemia  Additional history obtained: Urgent care record  Lab Tests  I Ordered, reviewed, and interpreted labs and EKG.  The pertinent results include:  CMP, CBC and lipase normal Troponin normal D-dimer 1.35 Negative pregnancy  Imaging Studies ordered:  I ordered imaging studies including  Chest x-ray normal Venous ultrasound left lower extremity normal I independently visualized and interpreted imaging and I agree with the radiologist interpretation.    Cardiac Monitoring:  The patient was maintained on a cardiac monitor.  I personally viewed and interpreted the cardiac monitored which showed an underlying rhythm of: Normal sinus rhythm   ED Course/Re-evaluation: Presents in no acute distress and is nontoxic-appearing.  Vitals without significant abnormality.  She is satting well at  100% on room air.  Pulse is 80.  Labs were obtained in triage and were overall reassuring although D-dimer was elevated to 1.35.  DVT study of the lower left extremity negative for clot.  Chest x-ray without acute findings.  Although her ultrasound was negative, given her exertional shortness of breath and chest pain with elevated D-dimer we will proceed with CTA to rule out PE.  I have also added on BNP given her occasional leg swelling and now chest pain or shortness of breath. On reevaluation, BNP was normal.  CTA was negative for PE or other acute findings.  Overall her work-up was very reassuring.  No evidence of sepsis, heart failure, ACS or PE.  There does not appear to be emergent cause of her chest pain and shortness of breath.  Will advise follow-up with her cardiologist.  Patient expresses understanding and is amenable to plan.  Disposition:  After consideration of the diagnostic results, physical exam, history and the patients response to treatment feel that the patent would benefit from discharge with strict return precautions.   Nonspecific chest pain: Plan and management as described above. Discharged home in good condition.  Final Clinical Impression(s) / ED Diagnoses Final diagnoses:  Nonspecific chest pain    Rx / DC Orders ED Discharge Orders     None         Janell Quiet, PA-C 05/19/22 2052    Virgina Norfolk, DO 05/19/22 2147

## 2022-05-19 NOTE — ED Provider Notes (Addendum)
[facility]    CSN: [id number] Arrival date & time: 05/19/22  1638      History   Chief Complaint Chief Complaint  Patient presents with   Chest Pain    HPI Rachel Newman is a 41 y.o. female.   HPI 41 year old female presents with left leg swelling, shortness of breath and pressure on chest.  Reports chest pain started last night with dyspnea on exertion took Prilosec with no relief.  Patient reports left leg thigh cramps after traveling in car for 9 hours reports left ankle and foot swollen since then.  PMH significant for morbid obesity, HTN, and anemia.  Past Medical History:  Diagnosis Date   Anemia    Herpes    Hypertension    Migraine     There are no problems to display for this patient.   Past Surgical History:  Procedure Laterality Date   CHOLECYSTECTOMY      OB History   No obstetric history on file.      Home Medications    Prior to Admission medications   Medication Sig Start Date End Date Taking? Authorizing Provider  SUMAtriptan (IMITREX) 25 MG tablet Take one tablet at the onset of migraine. May repeat in 2 hours if needed, do not exceed over 2 doses within a 24 hour period. 02/01/20  Yes [provider]  acetaminophen (TYLENOL) 325 MG tablet Take 325-650 mg by mouth every 6 (six) hours as needed for mild pain or headache.    [provider]  ascorbic acid (VITAMIN C) 500 MG tablet Take by mouth.    [provider]  calcium-vitamin D (OSCAL WITH D) 500-200 MG-UNIT tablet Take 1 tablet by mouth. Patient not taking: Reported on 05/19/2022    [provider]  Cholecalciferol 25 MCG (1000 UT) capsule Take by mouth.    [provider]  fluticasone (FLONASE) 50 MCG/ACT nasal spray Place 1 spray into both nostrils daily. 04/23/22   [provider]  hydrochlorothiazide (HYDRODIURIL) 12.5 MG tablet Take 12.5 mg by mouth daily.    [provider]  ibuprofen (ADVIL,MOTRIN) 200 MG tablet  Take 200-400 mg by mouth every 6 (six) hours as needed for headache or mild pain.    [provider]  iron polysaccharides (NIFEREX) 150 MG capsule Take 150 mg by mouth daily. 05/13/22   [provider]  Iron-Vitamin C (IRON 100/C) 100-250 MG TABS Take by mouth.    [provider]  metoprolol succinate (TOPROL-XL) 25 MG 24 hr tablet Take 25 mg by mouth 2 (two) times daily. 05/07/22   [provider]  omeprazole (PRILOSEC) 40 MG capsule Take 40 mg by mouth daily as needed. 05/07/22   [provider]    Family History Family History  Problem Relation Age of Onset   Hypertension Mother    Heart failure Mother    Hypertension Other    Diabetes Other    Breast cancer Neg Hx     Social History Social History   Tobacco Use   Smoking status: Never   Smokeless tobacco: Never  Vaping Use   Vaping Use: Never used  Substance Use Topics   Alcohol use: Yes    Comment: occ   Drug use: No     Allergies   Tape   Review of Systems Review of Systems  Respiratory:  Positive for shortness of breath.   Cardiovascular:  Positive for chest pain and leg swelling.  All other systems reviewed and are  negative.    Physical Exam Triage Vital Signs ED Triage Vitals  Enc Vitals Group     BP      Pulse      Resp      Temp      Temp src      SpO2      Weight      Height      Head Circumference      Peak Flow      Pain Score      Pain Loc      Pain Edu?      Excl. in GC?    No data found.  Updated Vital Signs BP 119/86 (BP Location: Left Arm)   Pulse 82   Temp 99.7 F (37.6 C) (Oral)   Resp 17   Ht 5\' 10"  (1.778 m)   Wt 260 lb (117.9 kg)   LMP 05/16/2022 (Exact Date)   SpO2 98%   BMI 37.31 kg/m      Physical Exam Vitals and nursing note reviewed.  Constitutional:      Appearance: Normal appearance. She is obese.  HENT:     Head: Normocephalic and atraumatic.     Mouth/Throat:     Mouth: Mucous membranes are moist.      Pharynx: Oropharynx is clear.  Eyes:     Extraocular Movements: Extraocular movements intact.     Conjunctiva/sclera: Conjunctivae normal.     Pupils: Pupils are equal, round, and reactive to light.  Neck:     Comments: No JVD, no bruit Cardiovascular:     Rate and Rhythm: Normal rate and regular rhythm.     Pulses: Normal pulses.     Heart sounds: Normal heart sounds.  Pulmonary:     Effort: Pulmonary effort is normal.     Breath sounds: Normal breath sounds. No wheezing or rales.  Musculoskeletal:     Cervical back: Normal range of motion and neck supple.     Comments: Bilateral lower legs (at inferior level of gastrocnemius bilaterally): TTP with mild soft tissue swelling noted bilateral circumference of both lower legs at this level are 41.5 cm  Skin:    General: Skin is warm and dry.  Neurological:     General: No focal deficit present.     Mental Status: She is alert and oriented to person, place, and time.      UC Treatments / Results  Labs (all labs ordered are listed, but only abnormal results are displayed) Labs Reviewed - No data to display  EKG   Radiology No results found.  Procedures Procedures (including critical care time)  Medications Ordered in UC Medications - No data to display  Initial Impression / Assessment and Plan / UC Course  I have reviewed the triage vital signs and the nursing notes.  Pertinent labs & imaging results that were available during my care of the patient were reviewed by me and considered in my medical decision making (see chart for details).     MDM: 1.  Chest pain, unspecified type-EKG revealed normal sinus rhythm; 2.  Pain and swelling of left lower leg-Advised patient to go to [facility] ED now for further evaluation of nonspecific chest pain and pain swelling of left lower leg.  Advised patient she will need CT of chest to rule out pulmonary embolism and ultrasound of left lower leg to rule out DVT.   Patient agreed and verbalized understanding of these instructions and this  plan of care.  Patient discharged, hemodynamically stable. Final Clinical Impressions(s) / UC Diagnoses   Final diagnoses:  Chest pain, unspecified type  Pain and swelling of left lower leg     Discharge Instructions      Advised patient to go to [place] ED now for further evaluation of nonspecific chest pain and pain swelling of left lower leg.  Advised patient she will need CT of chest to rule out pulmonary embolism and ultrasound of left lower leg to rule out DVT.     ED Prescriptions   None    PDMP not reviewed this encounter.   Eliezer Lofts, [place] 05/19/22 [place], [place], [place] 05/19/22 1727

## 2022-05-19 NOTE — ED Notes (Signed)
Patient transported to CT 

## 2022-05-19 NOTE — Discharge Instructions (Addendum)
Advised patient to go to [facility] health [facility] ED now for further evaluation of nonspecific chest pain and pain swelling of left lower leg.  Advised patient she will need CT of chest to rule out pulmonary embolism and ultrasound of left lower leg to rule out DVT.

## 2022-05-20 ENCOUNTER — Telehealth: Payer: Self-pay

## 2022-05-20 NOTE — Telephone Encounter (Signed)
TC to f/u w/ pt after yesterday's visit to [facility]. Pt reports she is no longer having cp. She says she went to the ED and "everything was ruled out." Pt is planning to f/u with her PCP soon. She has no questions or concerns.

## 2022-11-09 ENCOUNTER — Ambulatory Visit (HOSPITAL_COMMUNITY)
Admission: RE | Admit: 2022-11-09 | Payer: No Typology Code available for payment source | Source: Home / Self Care | Admitting: Gastroenterology

## 2022-11-09 ENCOUNTER — Encounter (HOSPITAL_COMMUNITY): Admission: RE | Payer: Self-pay | Source: Home / Self Care

## 2022-11-09 SURGERY — IMAGING PROCEDURE, GI TRACT, INTRALUMINAL, VIA CAPSULE

## 2022-11-09 NOTE — OR Nursing (Signed)
Patient did not show for OP givens small bowel pillcam study. VM left this a.m as well as midday. Office/Ashley for Dr Collene Mares notified and will contact patient to reschedule.

## 2023-04-29 ENCOUNTER — Other Ambulatory Visit: Payer: Self-pay

## 2023-04-29 ENCOUNTER — Emergency Department (HOSPITAL_BASED_OUTPATIENT_CLINIC_OR_DEPARTMENT_OTHER)
Admission: EM | Admit: 2023-04-29 | Discharge: 2023-04-29 | Disposition: A | Discharge status: Home / Self Care | Payer: No Typology Code available for payment source | Attending: Emergency Medicine | Admitting: Emergency Medicine

## 2023-04-29 ENCOUNTER — Encounter (HOSPITAL_BASED_OUTPATIENT_CLINIC_OR_DEPARTMENT_OTHER): Payer: Self-pay

## 2023-04-29 DIAGNOSIS — R42 Dizziness and giddiness: Secondary | ICD-10-CM | POA: Insufficient documentation

## 2023-04-29 DIAGNOSIS — Z79899 Other long term (current) drug therapy: Secondary | ICD-10-CM | POA: Insufficient documentation

## 2023-04-29 DIAGNOSIS — I10 Essential (primary) hypertension: Secondary | ICD-10-CM | POA: Insufficient documentation

## 2023-04-29 DIAGNOSIS — E876 Hypokalemia: Secondary | ICD-10-CM | POA: Insufficient documentation

## 2023-04-29 LAB — BASIC METABOLIC PANEL
Anion gap: 8 (ref 5–15)
BUN: 8 mg/dL (ref 6–20)
CO2: 25 mmol/L (ref 22–32)
Calcium: 8.9 mg/dL (ref 8.9–10.3)
Chloride: 102 mmol/L (ref 98–111)
Creatinine, Ser: 0.8 mg/dL (ref 0.44–1.00)
GFR, Estimated: >60 mL/min (ref >60)
Glucose, Bld: 75 mg/dL (ref 70–99)
Potassium: 3.3 mmol/L — ABNORMAL LOW (ref 3.5–5.1)
Sodium: 135 mmol/L (ref 135–145)

## 2023-04-29 LAB — CBC
HCT: 39.1 % (ref 36.0–46.0)
Hemoglobin: 12.1 g/dL (ref 12.0–15.0)
MCH: 21.9 pg — ABNORMAL LOW (ref 26.0–34.0)
MCHC: 30.9 g/dL (ref 30.0–36.0)
MCV: 70.7 fL — ABNORMAL LOW (ref 80.0–100.0)
Platelets: 295 10*3/uL (ref 150–400)
RBC: 5.53 MIL/uL — ABNORMAL HIGH (ref 3.87–5.11)
RDW: 16.1 % — ABNORMAL HIGH (ref 11.5–15.5)
WBC: 7.9 10*3/uL (ref 4.0–10.5)
nRBC: 0 % (ref 0.0–0.2)

## 2023-04-29 LAB — CBG MONITORING, ED
Glucose-Capillary: 66 mg/dL — ABNORMAL LOW (ref 70–99)
Glucose-Capillary: 69 mg/dL — ABNORMAL LOW (ref 70–99)
Glucose-Capillary: 89 mg/dL (ref 70–99)

## 2023-04-29 LAB — URINALYSIS, ROUTINE W REFLEX MICROSCOPIC
Bilirubin Urine: NEGATIVE
Glucose, UA: NEGATIVE mg/dL
Ketones, ur: NEGATIVE mg/dL
Leukocytes,Ua: NEGATIVE
Nitrite: NEGATIVE
Protein, ur: NEGATIVE mg/dL
Specific Gravity, Urine: 1.015 (ref 1.005–1.030)
pH: 6.5 (ref 5.0–8.0)

## 2023-04-29 LAB — PREGNANCY, URINE: Preg Test, Ur: NEGATIVE

## 2023-04-29 LAB — URINALYSIS, MICROSCOPIC (REFLEX)

## 2023-04-29 MED ORDER — MECLIZINE HCL 25 MG PO TABS
50.0000 mg | ORAL_TABLET | Freq: Once | ORAL | Status: AC
  Administered 2023-04-29: 50 mg via ORAL
  Filled 2023-04-29: qty 2

## 2023-04-29 MED ORDER — MECLIZINE HCL 25 MG PO TABS: 25.0000 mg | ORAL_TABLET | Freq: Three times a day (TID) | ORAL | 0 refills | Status: AC | PRN

## 2023-04-29 MED ORDER — POTASSIUM CHLORIDE CRYS ER 20 MEQ PO TBCR
40.0000 meq | EXTENDED_RELEASE_TABLET | Freq: Once | ORAL | Status: AC
  Administered 2023-04-29: 40 meq via ORAL
  Filled 2023-04-29: qty 2

## 2023-04-29 NOTE — ED Notes (Signed)
Reviewed discharge instructions and follow up with pt. Pt states understanding . Lightheadness improved

## 2023-04-29 NOTE — ED Triage Notes (Signed)
Patient had a episode of dizziness an hour ago. She is having lightheadedness a this time. She is also having nausea.

## 2023-04-29 NOTE — ED Provider Notes (Signed)
[facility] Provider Note   CSN: [id number] Arrival date & time: 04/29/23  1244     History  Chief Complaint  Patient presents with   Dizziness    Modell Shaum is a 42 y.o. female.  With history of hypertension, anemia, migraines who presents to the ED for evaluation of dizziness.  She states this occurred at approximately 10:30 AM.  She was walking down the hallway when this happened.  She then sat down in a chair and her symptoms improved.  Symptoms then returned when she attempted to ambulate.  She denies any history of similar.  She describes sensation as feeling wobbly on her feet.  She denies any headaches or vision changes.  No chest pain or shortness of breath.  The symptoms did make her feel nauseous.  No vomiting.  No numbness, weakness or tingling.  She denies any recent illnesses.  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  She says she did eat breakfast this morning.   Dizziness      Home Medications Prior to Admission medications   Medication Sig Start Date End Date Taking? Authorizing Provider  meclizine (ANTIVERT) 25 MG tablet Take 1 tablet (25 mg total) by mouth 3 (three) times daily as needed for up to 7 days for dizziness. 04/29/23 05/06/23 Yes Enedelia Martorelli, Edsel Petrin, PA-C  acetaminophen (TYLENOL) 325 MG tablet Take 325-650 mg by mouth every 6 (six) hours as needed for mild pain or headache.    [provider]  ascorbic acid (VITAMIN C) 500 MG tablet Take by mouth.    [provider]  calcium-vitamin D (OSCAL WITH D) 500-200 MG-UNIT tablet Take 1 tablet by mouth. Patient not taking: Reported on 05/19/2022    [provider]  Cholecalciferol 25 MCG (1000 UT) capsule Take by mouth.    [provider]  fluticasone (FLONASE) 50 MCG/ACT nasal spray Place 1 spray into both nostrils daily. 04/23/22   [provider]  hydrochlorothiazide (HYDRODIURIL) 12.5 MG tablet Take 12.5 mg by mouth  daily.    [provider]  ibuprofen (ADVIL,MOTRIN) 200 MG tablet Take 200-400 mg by mouth every 6 (six) hours as needed for headache or mild pain.    [provider]  iron polysaccharides (NIFEREX) 150 MG capsule Take 150 mg by mouth daily. 05/13/22   [provider]  Iron-Vitamin C (IRON 100/C) 100-250 MG TABS Take by mouth.    [provider]  metoprolol succinate (TOPROL-XL) 25 MG 24 hr tablet Take 25 mg by mouth 2 (two) times daily. 05/07/22   [provider]  omeprazole (PRILOSEC) 40 MG capsule Take 40 mg by mouth daily as needed. 05/07/22   [provider]  SUMAtriptan (IMITREX) 25 MG tablet Take one tablet at the onset of migraine. May repeat in 2 hours if needed, do not exceed over 2 doses within a 24 hour period. 02/01/20   [provider]      Allergies    Tape    Review of Systems   Review of Systems  Neurological:  Positive for dizziness.  All other systems reviewed and are negative.   Physical Exam Updated Vital Signs BP (!) 146/80   Pulse 72   Temp (!) 97.4 F (36.3 C)   Resp 17   Ht 5\' 10"  (1.778 m)   Wt 119.7 kg   LMP 03/17/2023 (Approximate)   SpO2 99%   BMI 37.88 kg/m  Physical Exam Vitals and  nursing note reviewed.  Constitutional:      General: She is not in acute distress.    Appearance: Normal appearance. She is well-developed. She is not ill-appearing, toxic-appearing or diaphoretic.     Comments: Resting comfortably in bed  HENT:     Head: Normocephalic and atraumatic.     Right Ear: Tympanic membrane and ear canal normal.     Left Ear: Tympanic membrane and ear canal normal.  Eyes:     Extraocular Movements: Extraocular movements intact.     Conjunctiva/sclera: Conjunctivae normal.     Pupils: Pupils are equal, round, and reactive to light.  Cardiovascular:     Rate and Rhythm: Normal rate and regular rhythm.     Heart sounds: No murmur heard. Pulmonary:     Effort: Pulmonary effort is  normal. No respiratory distress.     Breath sounds: Normal breath sounds.  Abdominal:     Palpations: Abdomen is soft.     Tenderness: There is no abdominal tenderness.  Musculoskeletal:        General: No swelling.     Cervical back: Neck supple.  Skin:    General: Skin is warm and dry.     Capillary Refill: Capillary refill takes less than 2 seconds.  Neurological:     General: No focal deficit present.     Mental Status: She is alert and oriented to person, place, and time.     Comments:   MENTAL STATUS: AAOx3   LANG/SPEECH: Fluent, intact naming, repetition & comprehension   CRANIAL NERVES:   II: Pupils equal and reactive   III, IV, VI: EOM intact, no gaze preference or deviation, very slight right-sided horizontal nystagmus   V: normal sensation of the face   VII: no facial asymmetry   VIII: normal hearing to speech   MOTOR: 5/5 in both upper and lower extremities   SENSORY: Normal to touch in all extremiteis   COORD: Normal finger to nose, heel to shin and shoulder shrug, no tremor, no dysmetria. No pronator drift.  Normal Romberg.  Normal observed gait.  Psychiatric:        Mood and Affect: Mood normal.     ED Results / Procedures / Treatments   Labs (all labs ordered are listed, but only abnormal results are displayed) Labs Reviewed  BASIC METABOLIC PANEL - Abnormal; Notable for the following components:      Result Value   Potassium 3.3 (*)    All other components within normal limits  CBC - Abnormal; Notable for the following components:   RBC 5.53 (*)    MCV 70.7 (*)    MCH 21.9 (*)    RDW 16.1 (*)    All other components within normal limits  URINALYSIS, ROUTINE W REFLEX MICROSCOPIC - Abnormal; Notable for the following components:   Hgb urine dipstick TRACE (*)    All other components within normal limits  URINALYSIS, MICROSCOPIC (REFLEX) - Abnormal; Notable for the following components:   Bacteria, UA RARE (*)    All other components within normal limits   CBG MONITORING, ED - Abnormal; Notable for the following components:   Glucose-Capillary 66 (*)    All other components within normal limits  CBG MONITORING, ED - Abnormal; Notable for the following components:   Glucose-Capillary 69 (*)    All other components within normal limits  PREGNANCY, URINE  CBG MONITORING, ED    EKG EKG Interpretation Date/Time:  Friday April 29 2023 13:04:09 EDT Ventricular Rate:  71 PR Interval:  166 QRS Duration:  70 QT Interval:  374 QTC Calculation: 406 R Axis:   31  Text Interpretation: Normal sinus rhythm Septal infarct , age undetermined Abnormal ECG When compared with ECG of 19-May-2022 18:01, No significant change since last tracing Confirmed by Alvino Blood ([id number]) on 04/29/2023 1:56:08 PM  Radiology No results found.  Procedures Procedures    Medications Ordered in ED Medications  meclizine (ANTIVERT) tablet 50 mg (50 mg Oral Given 04/29/23 1425)  potassium chloride SA (KLOR-CON M) CR tablet 40 mEq (40 mEq Oral Given 04/29/23 1424)    ED Course/ Medical Decision Making/ A&P                             Medical Decision Making Amount and/or Complexity of Data Reviewed Labs: ordered.  Risk Prescription drug management.  This patient presents to the ED for concern of dizziness, this involves an extensive number of treatment options, and is a complaint that carries with it a high risk of complications and morbidity.  The emergent differential diagnosis for acute vertigo low includes peripheral causes such as BPPV, barotrauma, ear foreign body, Mnire's disease, infectious causes such as lip bronchitis, vestibular neuritis or Ramsay Hunt syndrome.  Other emergent causes are central such as cerebellar stroke, vertebrobasilar insufficiency, neoplastic causes, vertebral artery dissection, MS, neurosyphilis or tuberculosis, epilepsy or migraine.  Other causes include anemia, hyperviscosity syndrome, alcohol or aminoglycoside use, renal  failure, hypoglycemia and thyroid disease.   Co morbidities that complicate the patient evaluation   hypertension, migraines  My initial workup includes labs, EKG, symptom control  Additional history obtained from: Nursing notes from this visit.  I ordered, reviewed and interpreted labs which include: CBC, BMP, urinalysis, urine pregnancy  Afebrile, hemodynamically stable.  42 year old female presenting to the ED for evaluation of dizziness.  This occurred while she was ambulating at work this afternoon.  It improved when she sat in a chair.  Her symptoms have returned intermittently since that time each time with ambulation and have resolved with rest.  States his symptoms are more mild than they were this morning.  She appears very well on physical exam.  She has no focal neurologic deficits.  She has a normal Romberg and observed gait.  Her lab workup was reassuring as well.  She was mildly hypoglycemic at 77 which was treated with orange juice in the ED.  This may be contributing to her symptoms, however believe she likely has positional vertigo.  Alternatively this may be an atypical migraine as she does have a history of migraines and was discontinued from her amitriptyline a few months ago.  She was treated with meclizine in the ED and reported improvement in her symptoms. She ambulated without return of symptoms.  Overall I have low suspicion for acute emergent intracranial abnormalities as the cause of her symptoms. She was sent a prescription for meclizine. She was given return precautions. Stable at discharge.  At this time there does not appear to be any evidence of an acute emergency medical condition and the patient appears stable for discharge with appropriate outpatient follow up. Diagnosis was discussed with patient who verbalizes understanding of care plan and is agreeable to discharge. I have discussed return precautions with patient who verbalizes understanding. Patient encouraged  to follow-up with their PCP within 5 days. All questions answered.  Note: Portions of this report may have been transcribed using voice recognition software. Every  effort was made to ensure accuracy; however, inadvertent computerized transcription errors may still be present.        Final Clinical Impression(s) / ED Diagnoses Final diagnoses:  Dizziness  Hypokalemia    Rx / DC Orders ED Discharge Orders          Ordered    meclizine (ANTIVERT) 25 MG tablet  3 times daily PRN        04/29/23 1520              Michelle Piper, Cordelia Poche 04/29/23 1523    Lonell Grandchild, MD 04/30/23 1450

## 2023-04-29 NOTE — ED Notes (Signed)
Glucose is 66. Drank 8oz of OJ. Will repeat glucose in 15 min

## 2023-04-29 NOTE — Discharge Instructions (Signed)
You have been seen today for your complaint of dizziness. Your lab work  showed low potassium but was otherwise reassuring. Your discharge medications include meclizine. This is a medicine used to help with your symptoms. Take it as needed. Home care instructions are as follows:  Change positions slowly Follow up with: your PCP within the next 5 days for reevaluation Please seek immediate medical care if you develop any of the following symptoms: Have difficulty speaking or moving. Are always dizzy or faint. Develop severe headaches. Have weakness in your legs or arms. Have changes in your hearing or vision. Develop a stiff neck. Develop sensitivity to light. At this time there does not appear to be the presence of an emergent medical condition, however there is always the potential for conditions to change. Please read and follow the below instructions.  Do not take your medicine if  develop an itchy rash, swelling in your mouth or lips, or difficulty breathing; call 911 and seek immediate emergency medical attention if this occurs.  You may review your lab tests and imaging results in their entirety on your MyChart account.  Please discuss all results of fully with your primary care provider and other specialist at your follow-up visit.  Note: Portions of this text may have been transcribed using voice recognition software. Every effort was made to ensure accuracy; however, inadvertent computerized transcription errors may still be present.

## 2023-08-16 ENCOUNTER — Other Ambulatory Visit: Payer: Self-pay | Admitting: Nurse Practitioner

## 2023-08-16 DIAGNOSIS — Z1231 Encounter for screening mammogram for malignant neoplasm of breast: Secondary | ICD-10-CM

## 2023-10-04 ENCOUNTER — Ambulatory Visit: Payer: No Typology Code available for payment source

## 2023-11-30 ENCOUNTER — Other Ambulatory Visit: Payer: Self-pay | Admitting: Nurse Practitioner

## 2023-11-30 DIAGNOSIS — Z1231 Encounter for screening mammogram for malignant neoplasm of breast: Secondary | ICD-10-CM

## 2023-12-01 ENCOUNTER — Other Ambulatory Visit: Payer: Self-pay | Admitting: Nurse Practitioner

## 2023-12-01 DIAGNOSIS — R5381 Other malaise: Secondary | ICD-10-CM

## 2023-12-09 ENCOUNTER — Other Ambulatory Visit: Payer: Self-pay | Admitting: Nurse Practitioner

## 2023-12-09 DIAGNOSIS — N644 Mastodynia: Secondary | ICD-10-CM

## 2024-01-16 ENCOUNTER — Ambulatory Visit
Admission: RE | Admit: 2024-01-16 | Discharge: 2024-01-16 | Disposition: A | Discharge status: Home / Self Care | Payer: No Typology Code available for payment source | Source: Ambulatory Visit | Attending: Nurse Practitioner | Admitting: Nurse Practitioner

## 2024-01-16 ENCOUNTER — Ambulatory Visit: Payer: No Typology Code available for payment source

## 2024-01-16 ENCOUNTER — Other Ambulatory Visit: Payer: Self-pay | Admitting: Nurse Practitioner

## 2024-01-16 DIAGNOSIS — N6320 Unspecified lump in the left breast, unspecified quadrant: Secondary | ICD-10-CM

## 2024-01-16 DIAGNOSIS — N644 Mastodynia: Secondary | ICD-10-CM

## 2024-01-25 ENCOUNTER — Other Ambulatory Visit

## 2024-03-07 ENCOUNTER — Ambulatory Visit
Admission: RE | Admit: 2024-03-07 | Discharge: 2024-03-07 | Disposition: A | Discharge status: Home / Self Care | Source: Ambulatory Visit | Attending: Nurse Practitioner | Admitting: Nurse Practitioner

## 2024-03-07 DIAGNOSIS — N6320 Unspecified lump in the left breast, unspecified quadrant: Secondary | ICD-10-CM

## 2024-03-07 HISTORY — PX: BREAST BIOPSY: SHX20

## 2024-03-08 LAB — SURGICAL PATHOLOGY
